# Patient Record
Sex: Male | Born: 1965 | Race: White | Hispanic: No | Smoking: Never smoker
Health system: Southern US, Community
[De-identification: ages and names within clinical notes are randomized; demographics above are authoritative.]

## PROBLEM LIST (undated history)

## (undated) DIAGNOSIS — I10 Essential (primary) hypertension: Secondary | ICD-10-CM

## (undated) DIAGNOSIS — M109 Gout, unspecified: Secondary | ICD-10-CM

## (undated) DIAGNOSIS — F419 Anxiety disorder, unspecified: Secondary | ICD-10-CM

## (undated) HISTORY — PX: ESOPHAGEAL DILATION: SHX303

---

## 2007-12-08 HISTORY — PX: OTHER SURGICAL HISTORY: SHX169

## 2014-11-20 DIAGNOSIS — K645 Perianal venous thrombosis: Secondary | ICD-10-CM | POA: Insufficient documentation

## 2015-07-22 LAB — CBC AND DIFFERENTIAL
HCT: 44 % (ref 41–53)
Hemoglobin: 15.2 g/dL (ref 13.5–17.5)
Platelets: 161 10*3/uL (ref 150–399)
WBC: 5.4 10^3/mL

## 2015-07-22 LAB — BASIC METABOLIC PANEL
BUN: 7 mg/dL (ref 4–21)
Creatinine: 0.9 mg/dL (ref 0.6–1.3)
Glucose: 92 mg/dL
Potassium: 4.5 mmol/L (ref 3.4–5.3)
Sodium: 144 mmol/L (ref 137–147)

## 2015-07-22 LAB — LIPID PANEL
Cholesterol: 133 mg/dL (ref 0–200)
HDL: 31 mg/dL — AB (ref 35–70)
LDL Cholesterol: 102 mg/dL
Triglycerides: 265 mg/dL — AB (ref 40–160)

## 2016-01-13 ENCOUNTER — Emergency Department (INDEPENDENT_AMBULATORY_CARE_PROVIDER_SITE_OTHER)
Admission: EM | Admit: 2016-01-13 | Discharge: 2016-01-13 | Disposition: A | Discharge status: Home / Self Care | Payer: Managed Care, Other (non HMO) | Source: Home / Self Care | Attending: Emergency Medicine | Admitting: Emergency Medicine

## 2016-01-13 ENCOUNTER — Encounter: Payer: Self-pay | Admitting: Emergency Medicine

## 2016-01-13 DIAGNOSIS — L03032 Cellulitis of left toe: Secondary | ICD-10-CM

## 2016-01-13 HISTORY — DX: Gout, unspecified: M10.9

## 2016-01-13 MED ORDER — DOXYCYCLINE HYCLATE 100 MG PO CAPS: 100.0000 mg | ORAL_CAPSULE | Freq: Two times a day (BID) | ORAL | Status: DC

## 2016-01-13 NOTE — ED Notes (Signed)
Left great toe, swollen and red. Looks like a paronychia, however he has hx of gout.

## 2016-01-13 NOTE — ED Provider Notes (Signed)
CSN: [id number]     Arrival date & time 01/13/16  1029 History   First MD Initiated Contact with Patient 01/13/16 1032     Chief Complaint  Patient presents with  . Toe Pain   (Consider location/radiation/quality/duration/timing/severity/associated sxs/prior Treatment) HPI 2 days of mild to moderate swelling and dull pain dorsum left great toe. Not affecting joint. Recalls no injury. No radiation, numbness, or weakness. Has not tried any particular treatment. Denies fever or chills or nausea or vomiting or chest pain or shortness of breath. He states he had a diagnosis of possible gout right ankle a few years ago, which resolved and has not recurred now that he's eating healthier. He denies history of crystal documented gout. Denies itch Past Medical History  Diagnosis Date  . Gout    History reviewed. No pertinent past surgical history. Family History  Problem Relation Age of Onset  . Hypertension Mother   . ALS Father    Social History  Substance Use Topics  . Smoking status: Never Smoker   . Smokeless tobacco: None  . Alcohol Use: Yes    Review of Systems  All other systems reviewed and are negative.   Allergies  Morphine and related  Home Medications   Prior to Admission medications   Medication Sig Start Date End Date Taking? Authorizing Provider  esomeprazole (NEXIUM) 40 MG capsule Take 40 mg by mouth daily at 12 noon.   Yes Historical Provider, MD  fluticasone (FLONASE) 50 MCG/ACT nasal spray Place into both nostrils daily.   Yes Historical Provider, MD  testosterone cypionate (DEPOTESTOTERONE CYPIONATE) 100 MG/ML injection Inject into the muscle every 14 (fourteen) days. For IM use only   Yes Historical Provider, MD  doxycycline (VIBRAMYCIN) 100 MG capsule Take 1 capsule (100 mg total) by mouth 2 (two) times daily. For 10 days 01/13/16   Lajean Manes, MD   Meds Ordered and Administered this Visit  Medications - No data to display  BP 147/94 mmHg  Pulse 86   Temp(Src) 98 F (36.7 C) (Oral)  Ht 6' (1.829 m)  Wt 215 lb (97.523 kg)  BMI 29.15 kg/m2  SpO2 99% No data found.   Physical Exam  Constitutional: He is oriented to person, place, and time. He appears well-developed and well-nourished. No distress.  HENT:  Head: Normocephalic and atraumatic.  Eyes: Conjunctivae and EOM are normal. Pupils are equal, round, and reactive to light. No scleral icterus.  Neck: Normal range of motion.  Cardiovascular: Normal rate.   Pulmonary/Chest: Effort normal.  Abdominal: He exhibits no distension.  Musculoskeletal: Normal range of motion.       Left ankle: Normal. No tenderness. Achilles tendon normal.       Left foot: There is normal range of motion, no bony tenderness and normal capillary refill.       Feet:  Left foot:  As depicted, area of redness, warmth, induration, mild tenderness just proximal to the cuticle of the left great toe. No fluctuance or drainage or bleeding or any open wound. No red streaks. No tenderness or heat or swelling over first MTPJ. Tendons, range of motion, neurovascular intact. There is no sign of ingrown toenail or abnormality of the toenail. Neurovascular distally intact  Neurological: He is alert and oriented to person, place, and time.  Skin: Skin is warm.  Psychiatric: He has a normal mood and affect.  Nursing note and vitals reviewed.   ED Course  Procedures (including critical care time)  Labs Review Labs Reviewed -  No data to display  Imaging Review No results found.    MDM   1. Cellulitis of great toe of left foot    no evidence of fluctuance or red streaks or any bone involvement. Clinically, not consistent with gout as no bone or joint involvement. Afebrile, no evidence of toxicity. This is likely a mild paronychia left great toe.  Treatment options discussed, as well as risks, benefits, alternatives. Patient voiced understanding and agreement with the following plans: New Prescriptions    DOXYCYCLINE (VIBRAMYCIN) 100 MG CAPSULE    Take 1 capsule (100 mg total) by mouth 2 (two) times daily. For 10 days   Warm soaks and other symptomatic care discussed. Follow-up with your primary care doctor in 5-7 days if not improving, or sooner if symptoms become worse. If worsening or becomes fluctuant, return here to urgent care to consider I&D, but that is not indicated at this time. Precautions discussed. Red flags discussed. Questions invited and answered. Patient voiced understanding and agreement.    Lajean Manes, MD 01/13/16 [phone]

## 2016-03-30 ENCOUNTER — Ambulatory Visit: Payer: Managed Care, Other (non HMO) | Admitting: Physician Assistant

## 2016-04-06 ENCOUNTER — Encounter: Payer: Self-pay | Admitting: Physician Assistant

## 2016-04-06 ENCOUNTER — Ambulatory Visit (INDEPENDENT_AMBULATORY_CARE_PROVIDER_SITE_OTHER): Payer: Managed Care, Other (non HMO) | Admitting: Physician Assistant

## 2016-04-06 VITALS — BP 162/93 | HR 78 | Ht 72.0 in | Wt 220.0 lb

## 2016-04-06 DIAGNOSIS — K2100 Gastro-esophageal reflux disease with esophagitis, without bleeding: Secondary | ICD-10-CM

## 2016-04-06 DIAGNOSIS — K222 Esophageal obstruction: Secondary | ICD-10-CM

## 2016-04-06 DIAGNOSIS — G4700 Insomnia, unspecified: Secondary | ICD-10-CM

## 2016-04-06 DIAGNOSIS — F4323 Adjustment disorder with mixed anxiety and depressed mood: Secondary | ICD-10-CM | POA: Insufficient documentation

## 2016-04-06 DIAGNOSIS — F419 Anxiety disorder, unspecified: Secondary | ICD-10-CM | POA: Insufficient documentation

## 2016-04-06 DIAGNOSIS — R079 Chest pain, unspecified: Secondary | ICD-10-CM | POA: Insufficient documentation

## 2016-04-06 DIAGNOSIS — I10 Essential (primary) hypertension: Secondary | ICD-10-CM | POA: Diagnosis not present

## 2016-04-06 DIAGNOSIS — K219 Gastro-esophageal reflux disease without esophagitis: Secondary | ICD-10-CM | POA: Insufficient documentation

## 2016-04-06 MED ORDER — LORAZEPAM 1 MG PO TABS: 1.0000 mg | ORAL_TABLET | Freq: Two times a day (BID) | ORAL | Status: DC

## 2016-04-06 MED ORDER — FLUTICASONE PROPIONATE 50 MCG/ACT NA SUSP: 2.0000 | Freq: Every day | NASAL | Status: DC

## 2016-04-06 MED ORDER — ZOLPIDEM TARTRATE 10 MG PO TABS: 10.0000 mg | ORAL_TABLET | Freq: Every evening | ORAL | Status: DC | PRN

## 2016-04-06 MED ORDER — FLUOXETINE HCL 20 MG PO TABS: 20.0000 mg | ORAL_TABLET | Freq: Every day | ORAL | Status: DC

## 2016-04-06 MED ORDER — METOPROLOL SUCCINATE ER 25 MG PO TB24: 25.0000 mg | ORAL_TABLET | Freq: Every day | ORAL | Status: DC

## 2016-04-06 NOTE — Progress Notes (Signed)
Subjective:    Patient ID: Mason Rangel, male    DOB: 06/28/1966, 50 y.o.   MRN: [id number]  HPI  Pt is a 50 yo male who presents to the clinic to establish care.   .. Active Ambulatory Problems    Diagnosis Date Noted  . Left sided chest pain 04/06/2016  . Anxiety 04/06/2016  . Insomnia 04/06/2016  . Adjustment disorder with mixed anxiety and depressed mood 04/06/2016  . Essential hypertension, benign 04/06/2016  . GERD (gastroesophageal reflux disease) 04/06/2016  . Esophageal stenosis 04/06/2016   Resolved Ambulatory Problems    Diagnosis Date Noted  . No Resolved Ambulatory Problems   Past Medical History  Diagnosis Date  . Gout    .[name] Kitchen Family History  Problem Relation Age of Onset  . Hypertension Mother   . ALS Father   . Stroke Maternal Grandfather   . Heart attack Paternal Grandmother   . Heart attack Paternal Grandfather    .[name] Kitchen Social History   Social History  . Marital Status: Divorced    Spouse Name: N/A  . Number of Children: N/A  . Years of Education: N/A   Occupational History  . Not on file.   Social History Main Topics  . Smoking status: Never Smoker   . Smokeless tobacco: Not on file  . Alcohol Use: Yes  . Drug Use: No  . Sexual Activity: Not Currently   Other Topics Concern  . Not on file   Social History Narrative   On 4/28 he went to ER with left sided CP. Full cardiac work up was done and negative for any cardiac pathology. They thought is was likely due to stress. Pt can confirm that. He has had some recent stressors. A relationship recently broke up and he was paired with a partner that has a reputation of being hard to work with. He has had elevated BP and has a family hx of MI. He is having problems sleeping and concentrating. He works hard but feels "life has caught up with him". He admited that night in hotel before he went to ER he thought might be better if it was all over. He now feels differently. He knows he is loved.      Review of Systems  All other systems reviewed and are negative.      Objective:   Physical Exam  Constitutional: He is oriented to person, place, and time. He appears well-developed and well-nourished.  HENT:  Head: Normocephalic and atraumatic.  Cardiovascular: Normal rate, regular rhythm and normal heart sounds.   Pulmonary/Chest: Effort normal and breath sounds normal. He has no wheezes.  Abdominal: Soft. Bowel sounds are normal.  Neurological: He is alert and oriented to person, place, and time.  Skin: Skin is dry.  Psychiatric: He has a normal mood and affect. His behavior is normal.          Assessment & Plan:  Left sided chest pain- has been seen by ER and ruled out cardiac causes. After today pt is under a lot of anxiety and stress that I think is contributing to chest pain.   Insomnia- refilled ambien for 6 months.   HTN- remains elevated. Hx of heart palpitations. Will start metoprolol daily.   Anxiety/adjustment disorder- started prozac daily. RF and side effects discussed. I do think patient would benefit from counseling. Will make referral to counseling. Written out of work for 2weeks. Can return without any limitations on may 15th 2017. Ativan given as needed.  Discussed abuse potential. Suggested 1/2 tablet bid or as needed.

## 2016-04-08 ENCOUNTER — Ambulatory Visit: Payer: Managed Care, Other (non HMO) | Admitting: Physician Assistant

## 2016-04-10 ENCOUNTER — Telehealth: Payer: Self-pay | Admitting: *Deleted

## 2016-04-10 NOTE — Telephone Encounter (Signed)
Pt left vm wanting you to know that he's stopping the Ativan due to it giving him "severe deathly dreams".  He also stated that he has an appt with a counselor at [facility] on the 29th.

## 2016-04-13 ENCOUNTER — Encounter: Payer: Self-pay | Admitting: Physician Assistant

## 2016-04-15 ENCOUNTER — Telehealth: Payer: Self-pay | Admitting: *Deleted

## 2016-04-15 NOTE — Telephone Encounter (Signed)
Spoke with pt today and he wanted to give you some updates.  He is currently adjusting his ativan & ambien to find the right combination.  Currently, he's doing 1 tab of ambien & half tab of ativan but this is making his sleep too much, so he's is going to try half a tab of ambien with the half of ativan. Also, he hasn't started the prozac yet.  He has an appt on the 26th with the counselor.  His short term disability is approved until Sunday May 28, making the actual return day the day after [facility].  He has a form for you to fill out for his release to work and will drop that off for you sometime next week.

## 2016-04-16 NOTE — Telephone Encounter (Signed)
Please take ativan ok allergy list. Encourage him to start prozac this can also help his sleep.

## 2016-04-17 NOTE — Telephone Encounter (Signed)
Done

## 2016-05-18 ENCOUNTER — Ambulatory Visit: Payer: Managed Care, Other (non HMO) | Admitting: Physician Assistant

## 2016-05-22 ENCOUNTER — Ambulatory Visit: Payer: Managed Care, Other (non HMO) | Admitting: Physician Assistant

## 2016-05-25 ENCOUNTER — Ambulatory Visit (INDEPENDENT_AMBULATORY_CARE_PROVIDER_SITE_OTHER): Payer: Managed Care, Other (non HMO) | Admitting: Physician Assistant

## 2016-05-25 ENCOUNTER — Encounter: Payer: Self-pay | Admitting: Physician Assistant

## 2016-05-25 VITALS — BP 135/96 | HR 85 | Ht 72.0 in | Wt 224.0 lb

## 2016-05-25 DIAGNOSIS — I10 Essential (primary) hypertension: Secondary | ICD-10-CM | POA: Diagnosis not present

## 2016-05-25 DIAGNOSIS — F4323 Adjustment disorder with mixed anxiety and depressed mood: Secondary | ICD-10-CM | POA: Diagnosis not present

## 2016-05-25 DIAGNOSIS — F419 Anxiety disorder, unspecified: Secondary | ICD-10-CM

## 2016-05-25 MED ORDER — BUPROPION HCL ER (XL) 150 MG PO TB24: 150.0000 mg | ORAL_TABLET | Freq: Every day | ORAL | Status: DC

## 2016-05-25 MED ORDER — METOPROLOL SUCCINATE ER 25 MG PO TB24: 25.0000 mg | ORAL_TABLET | Freq: Every day | ORAL | Status: DC

## 2016-05-25 NOTE — Progress Notes (Signed)
   Subjective:    Patient ID: Mason Rangel, male    DOB: 07/07/1966, 50 y.o.   MRN: [id number]  HPI Patient is a 50 year old male who presents to the clinic for follow-up on anxiety/depression/insomnia. He states he is doing much better today. He is working with the therapist on his mood. He did start Prozac but he did not like the way it made him feel. He went ahead and stopped it. He is talked with other providers and would like to try Wellbutrin. He denies any suicidal or homicidal thoughts. He is doing well at work. He has really lost to the therapist that he really needs to move out of his parents house. He thinks if he had some independency he might be able to have more of a social life. His sleep has improved. He is not taking Ativan. He is only taking Ambien. He denies any shortness of breath, palpitations, chest pains, he takes metoprolol daily. He's been checking his blood pressures at home and they've been ranging in the 120s to 130s over 80s to 90s.   Review of Systems  All other systems reviewed and are negative.      Objective:   Physical Exam  Constitutional: He is oriented to person, place, and time. He appears well-developed and well-nourished.  HENT:  Head: Normocephalic and atraumatic.  Cardiovascular: Normal rate, regular rhythm and normal heart sounds.   Pulmonary/Chest: Effort normal and breath sounds normal.  Neurological: He is alert and oriented to person, place, and time.  Psychiatric: He has a normal mood and affect. His behavior is normal.          Assessment & Plan:  Adjustment disorder mixed anxiety and depression- PHQ-9 was 9 and GAD-7 was 5. Will start wellbutrin at patients request. Already stopped prozac. Discussed SE's of wellbutrin. Follow up in 2 months.   Insomnia- continue ambien. Continue to work on meditation and deep breathing techniques.   HTN- much improved on metoprolol. At home ranging 120-130/80-90. Refilled for next 6 months.

## 2016-06-02 ENCOUNTER — Other Ambulatory Visit: Payer: Self-pay | Admitting: *Deleted

## 2016-06-02 MED ORDER — FLUTICASONE PROPIONATE 50 MCG/ACT NA SUSP
2.0000 | Freq: Every day | NASAL | Status: DC
Start: 2016-06-02 — End: 2018-03-30

## 2016-06-05 ENCOUNTER — Other Ambulatory Visit: Payer: Self-pay | Admitting: *Deleted

## 2016-06-05 ENCOUNTER — Telehealth: Payer: Self-pay | Admitting: *Deleted

## 2016-06-05 MED ORDER — TRAZODONE HCL 50 MG PO TABS: 50.0000 mg | ORAL_TABLET | Freq: Every evening | ORAL | Status: DC | PRN

## 2016-06-05 NOTE — Telephone Encounter (Signed)
Pt notified of rx. 

## 2016-06-05 NOTE — Telephone Encounter (Signed)
Pt left vm stating that his therapist suggested Traodone for him instead of Ambien.

## 2016-06-05 NOTE — Telephone Encounter (Signed)
Ok to send trazadone 50mg  and may take up to 2 tablets one hour before bed #60 2 refills.

## 2016-07-10 ENCOUNTER — Ambulatory Visit (INDEPENDENT_AMBULATORY_CARE_PROVIDER_SITE_OTHER): Payer: Managed Care, Other (non HMO) | Admitting: Physician Assistant

## 2016-07-10 ENCOUNTER — Ambulatory Visit: Payer: Managed Care, Other (non HMO)

## 2016-07-10 ENCOUNTER — Encounter: Payer: Self-pay | Admitting: Physician Assistant

## 2016-07-10 ENCOUNTER — Ambulatory Visit (INDEPENDENT_AMBULATORY_CARE_PROVIDER_SITE_OTHER): Payer: Managed Care, Other (non HMO)

## 2016-07-10 VITALS — BP 164/100 | HR 79 | Ht 72.0 in | Wt 229.0 lb

## 2016-07-10 DIAGNOSIS — M25476 Effusion, unspecified foot: Secondary | ICD-10-CM | POA: Insufficient documentation

## 2016-07-10 DIAGNOSIS — M79671 Pain in right foot: Secondary | ICD-10-CM | POA: Insufficient documentation

## 2016-07-10 DIAGNOSIS — I10 Essential (primary) hypertension: Secondary | ICD-10-CM | POA: Diagnosis not present

## 2016-07-10 DIAGNOSIS — M7731 Calcaneal spur, right foot: Secondary | ICD-10-CM

## 2016-07-10 DIAGNOSIS — K2100 Gastro-esophageal reflux disease with esophagitis, without bleeding: Secondary | ICD-10-CM

## 2016-07-10 DIAGNOSIS — M25474 Effusion, right foot: Secondary | ICD-10-CM

## 2016-07-10 DIAGNOSIS — K222 Esophageal obstruction: Secondary | ICD-10-CM

## 2016-07-10 MED ORDER — MELOXICAM 15 MG PO TABS: 15.0000 mg | ORAL_TABLET | Freq: Every day | ORAL | 1 refills | Status: DC

## 2016-07-10 MED ORDER — DEXLANSOPRAZOLE 60 MG PO CPDR: 60.0000 mg | DELAYED_RELEASE_CAPSULE | Freq: Every day | ORAL | 5 refills | Status: DC

## 2016-07-10 NOTE — Progress Notes (Signed)
   Subjective:    Patient ID: Mason Rangel, male    DOB: 1966/03/04, 50 y.o.   MRN: [id number]  HPI Patient is a 50 year old male who presents to the clinic with swelling around his right ankle and foot accompanied by pain. This has been going on for approximately 20 days. He does not remember any acute injury or trauma. He does remember the morning 20 days ago that he woke up and when he stepped out of bed there was some pain. It has continued to swell off and on and be painful when he is walking and inverting his ankle. He has not tried anything to make better. He has been walking differently to avoid putting any pressure on the heel of his foot.  He was recently in the emergency room after having a piece of chicken wash in his esophagus. His blood pressure has been high since. He denies any chest pains, palpitations, headaches or dizziness. He is on Nexium daily. He was told after the EGD that there was lots of inflammation in his esophagus. He has not been told to change medications.   Review of Systems See HPI>     Objective:   Physical Exam  Constitutional: He is oriented to person, place, and time. He appears well-developed and well-nourished.  HENT:  Head: Normocephalic and atraumatic.  Cardiovascular: Normal rate, regular rhythm and normal heart sounds.   Pulmonary/Chest: Effort normal and breath sounds normal.  Musculoskeletal:  ROM of right foot limited due to swelling and pain.  No pain with ROM except resisted inversion.  Tenderness to palpation just infront of lateral malleolus at the suspect cubiod bone.  Non-pitting swelling around lateral malleolous into right forefoot.   Neurological: He is alert and oriented to person, place, and time.  Skin: Skin is dry.  Psychiatric: He has a normal mood and affect. His behavior is normal.          Assessment & Plan:  Essential hypertension-blood pressure uncontrolled today. Increase metoprolol to 50 mg for the next 2-3  days. If blood pressure not under 140/90 increased to 75 mg and if not to goal increased to 100 mg. Follow-up in 2 weeks to recheck blood pressure.  Right ankle and foot swelling and pain- xray normal other than calcaneal spur. Venous U/s negative for DVT. Unclear what is exactly causing this pain however I think it is musculoskeletal. There could be some tendinitis or even a metatarsal stress fracture. I went ahead and placed him in a postop shoe today to take some of the pressure off his foot. Discussed icing and elevation. I would like for him to stay off his feet mostly this weekend. He was given meloxicam to take once daily for inflammation and pain. I would like for him to stay in the boot for at least 2 weeks. Follow-up in 2 weeks.  GERD/esophageal stenosis-patient had episode where he had to go to the emergency room and have a piece of chicken surgically removed due to obstruction. They told him he had a lot of irritation and polyps in his esophagus. He is on Nexium but they have not changed any of his medications. He has not heard from his GI doctor. I will go ahead and switch him from Nexium to Dexilant daily.

## 2016-07-10 NOTE — Patient Instructions (Addendum)
Wear post op shoe. Ice twice a day for 15 minutes. mobic daily for next 2 weeks.  Get ultrasound to rule out bloodclot.  Increase metoprlol to  if not under 140/90 increase to .  Follow up in 2 weeks for BP check .    Metatarsal Fracture With Rehab A metatarsal fracture is a broken bone in one of the five bones that connect your toes to the rest of your foot (forefoot fracture). Metatarsals are long bones that can be stressed or cracked easily. A metatarsal fracture can be:  A stress fracture. Stress fractures are cracks in the surface of the metatarsal bone. Athletes often get stress fractures.  A complete fracture. A complete fracture goes all the way through the bone. The bone that connects to the pinky toe (fifth metatarsal) is the most commonly fractured metatarsal. Ballet dancers often fracture this bone. CAUSES  Stress fractures may be caused by:  Poor training technique.  Sudden increase in activity.  Changing your activity to a harder surface.  Wearing athletic shoes that do not have enough cushioning. Complete fractures are usually caused by:  Dropping a heavy object on your foot.  An injury that severely twists your foot. SIGNS AND SYMPTOMS The most common symptom of a stress fracture is foot pain that goes away with rest. The most common symptom of a complete fracture is intense pain that persists after the injury. Other symptoms of both fracture types include:  Bruising.  Swelling.  Pain with movement or putting weight on the foot.  Tenderness or pain with pressure.  Trouble walking. DIAGNOSIS  Your health care provider may suspect a metatarsal fracture based on your symptoms and medical history. Your health care provider will also do a physical exam. During the physical exam, your health care provider may try to move your foot and toes to check for pain and limited movement. Your foot will also be checked  for:  Bruising.  Tenderness.  Swelling.  Deformity. Other tests that may be done include:  X-rays. X-rays are able to show most fractures.  A bone scan. This test may be necessary to show a stress fracture. TREATMENT  Treatment for a metatarsal fracture depends on how severe the fracture was and the type of fracture. Treatment may include:  Surgery. Surgery is usually needed to repair a displaced fracture. A displaced fracture happens when pieces of the broken bone are moved out of place (displacement). After surgery, you may need to wear a short walking cast for 6 to 8 weeks.  Medicines. Medicines may be used to reduce swelling and pain.  Physical therapy. Physical therapy may last for several months.  Use of a supportive device, such as:  Elastic wrap.  Splint.  Boot.  Cast.  Crutches to support weight on your foot until the broken bone heals. You can usually treat a stress fracture or a nondisplaced fracture with:   Rest.  Ice.  Elevation.  Support. HOME CARE INSTRUCTIONS  Follow all your health care provider's instructions.  Take medicine only as directed by your health care provider.  Rest your foot until your health care provider says you can resume your usual activities.  When resting, keep your foot raised above the level of your heart (elevated).  Ice may help reduce pain and swelling.  Place ice in a plastic bag.  Place a towel between your skin and the bag.  Leave the ice on for 20 minutes, 2-3 times a day.  Wear your supportive device  as directed.  Do not get your cast or splint wet.  Keep all follow-up visits as directed by your health care provider. If your health care provider recommended physical therapy, it is very important for proper healing of your injury that you keep all visits. PREVENTION  After your fracture has healed, you can prevent another fracture by:  Starting new sports activities gradually.  Cross training to avoid  putting stress on the same part of your foot every day (such as alternate running with swimming or biking).  Eat a healthy diet that includes plenty of calcium and vitamin D.  Wear the right athletic shoes for your sport. Replace them when they wear out.  Stop your activity or training if you have pain or swelling in your foot. Rest for a few days. SEEK MEDICAL CARE IF:  You have pain that is getting worse.  You develop chills or fever.  Your foot feels numb.  Your cast or splint is damaged.  You notice swelling or redness below your cast or splint. WHAT REHABILITATION EXERCISES CAN I DO AT HOME? Ask your health care provider or physical therapist when you can start doing exercises at home. Doing these exercises 3-5 times a week can help you regain strength and flexibility. If doing any of these exercises causes pain, stop and contact your health care provider or physical therapist. Heel Cord Stretch Do 2 sets of 10 repetitions.  Stand facing a wall with your healthy foot forward and your knee slightly bent.  Place both hands on the wall for support.  Stretch your healing foot out straight behind you.  Keep both heels on the floor.  Hold the stretch for 30 seconds.  You can also do this exercise with both knees slightly bent. Repeat the same steps. Golf [employer] Do this once every day.  Sit on a chair and roll a golf ball under your healing foot for two minutes. Towel Stretch  Sit on the floor with your legs out straight.  Loop a towel around the front of your healing foot.  Use both hands to pull the ends of the towel, stretching your foot back toward your body.  Hold the stretch for 30 seconds and repeat 3 times. Calf Raises Do 2 sets of 10 repetitions.  Stand behind a chair and use your hands to support you.  Lift your good foot off the ground.  Rise up on the front of your healing foot as high as you can.  Repeat the lift 10 times. Marble Pickup Do this  once a day.  Sit in a chair and put 20 marbles on the floor in front of you.  Use the toes of your healing foot to pick up each marble. Towel Curls Repeat this exercise 5 times.  Sit in a chair and place a small towel on the floor in front of you.  Use the toes of your healing foot to grab the towel and pull it toward you.   This information is not intended to replace advice given to you by your health care provider. Make sure you discuss any questions you have with your health care provider.   Document Released: 11/23/2005 Document Revised: 04/09/2015 Document Reviewed: 03/08/2014 Elsevier Interactive Patient Education [employer]

## 2016-08-04 ENCOUNTER — Other Ambulatory Visit: Payer: Self-pay | Admitting: *Deleted

## 2016-08-04 MED ORDER — METOPROLOL SUCCINATE ER 25 MG PO TB24: 25.0000 mg | ORAL_TABLET | Freq: Every day | ORAL | 1 refills | Status: DC

## 2016-08-04 MED ORDER — DEXLANSOPRAZOLE 60 MG PO CPDR: 60.0000 mg | DELAYED_RELEASE_CAPSULE | Freq: Every day | ORAL | 1 refills | Status: DC

## 2016-09-08 ENCOUNTER — Other Ambulatory Visit: Payer: Self-pay | Admitting: *Deleted

## 2016-09-08 MED ORDER — TRAZODONE HCL 50 MG PO TABS: 50.0000 mg | ORAL_TABLET | Freq: Every evening | ORAL | 1 refills | Status: DC | PRN

## 2016-09-18 ENCOUNTER — Other Ambulatory Visit: Payer: Self-pay | Admitting: Sports Medicine

## 2016-09-18 MED ORDER — VALACYCLOVIR HCL 1 G PO TABS: 1000.0000 mg | ORAL_TABLET | Freq: Every day | ORAL | 0 refills | Status: DC

## 2016-10-03 IMAGING — US US EXTREM LOW VENOUS*R*
1 series · 13 of 24 positions shown · non-contrast
Comparison: None.

CLINICAL DATA: 50-year-old presenting with an approximate 2-3 week
history of right foot pain and swelling. No known injuries.



[Series 1: us extrem low venous*right* · 0.07mm/px · 13 of 28 slices shown]
[im 1/28]
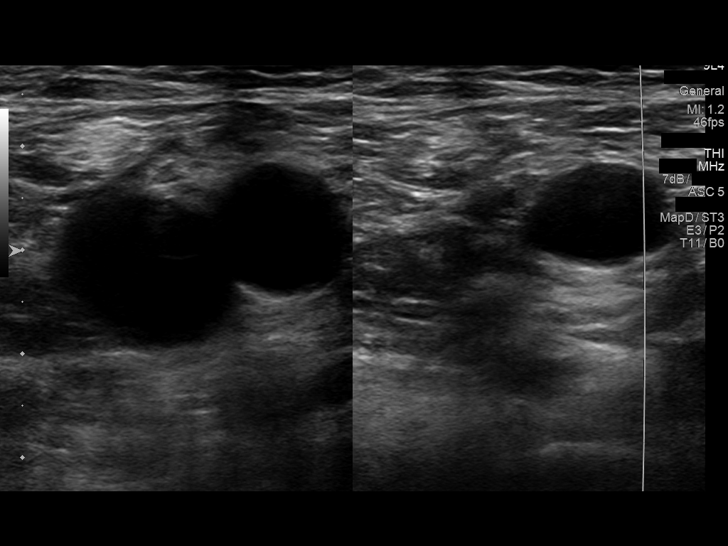
[im 3/28]
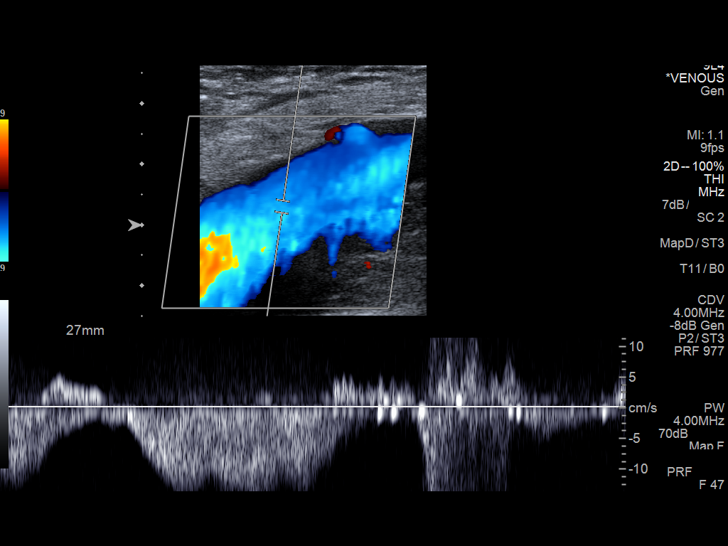
[im 5/28]
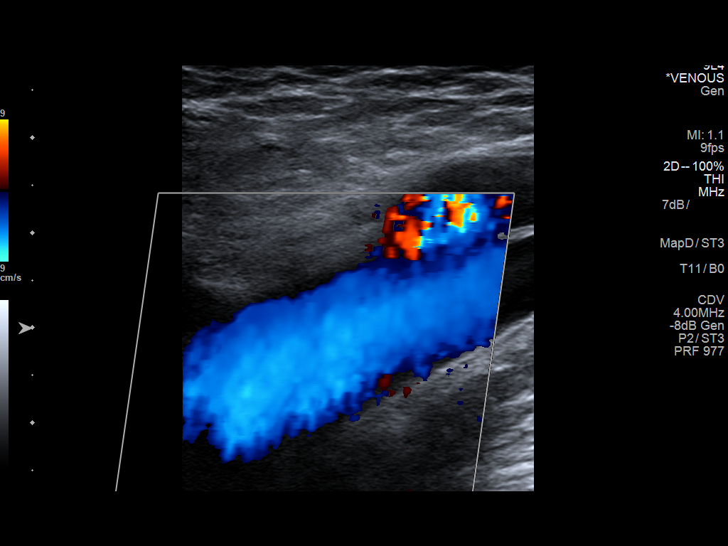
[im 8/28]
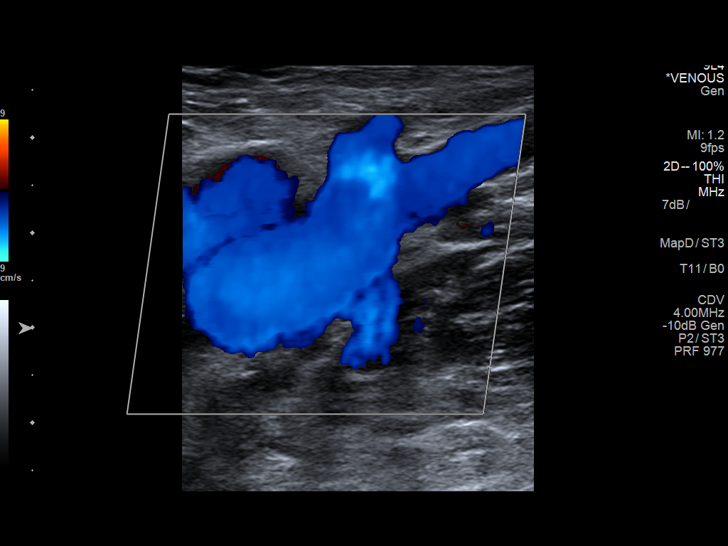
[im 10/28]
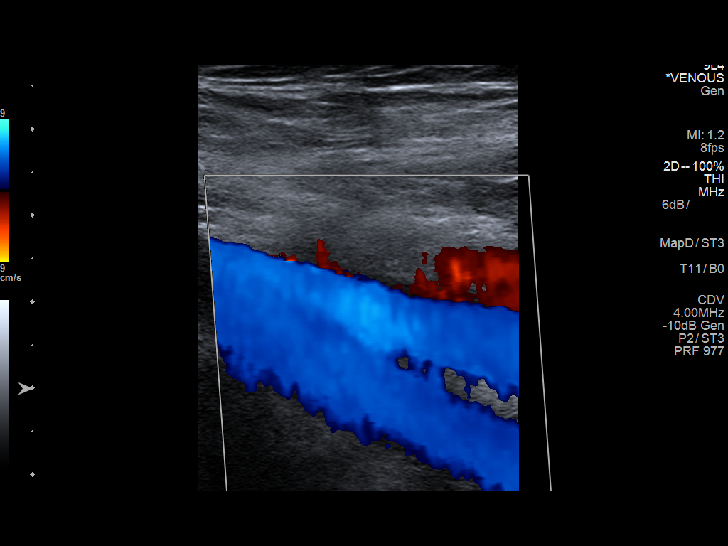
[im 12/28]
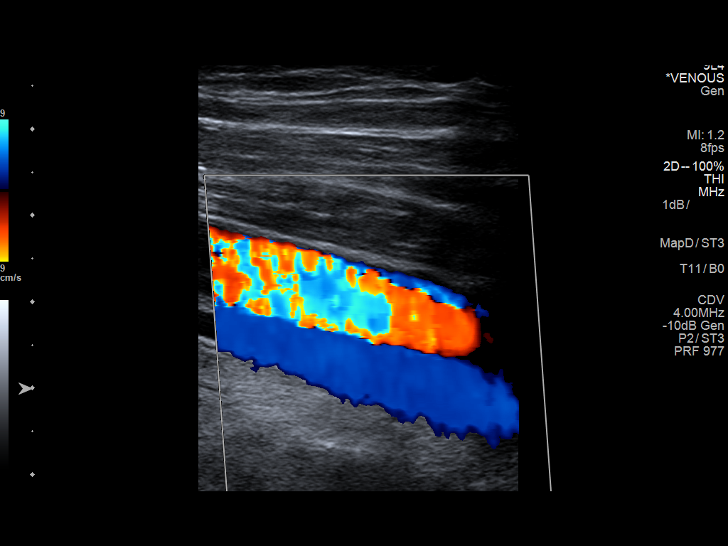
[im 15/28]
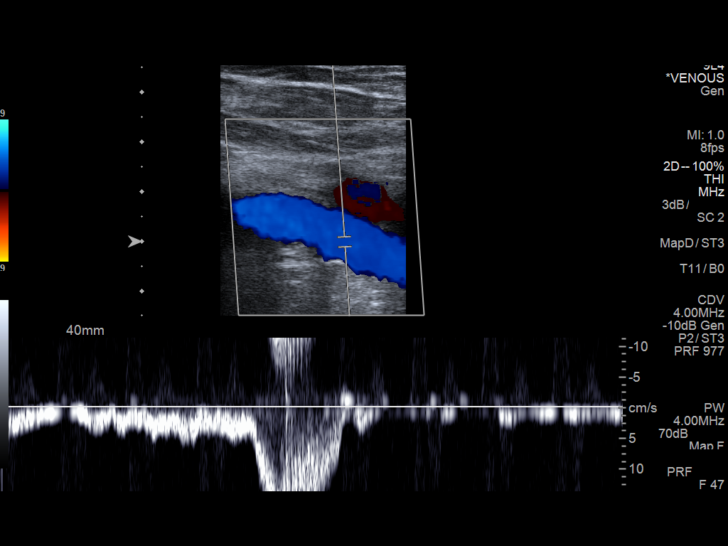
[im 16/28]
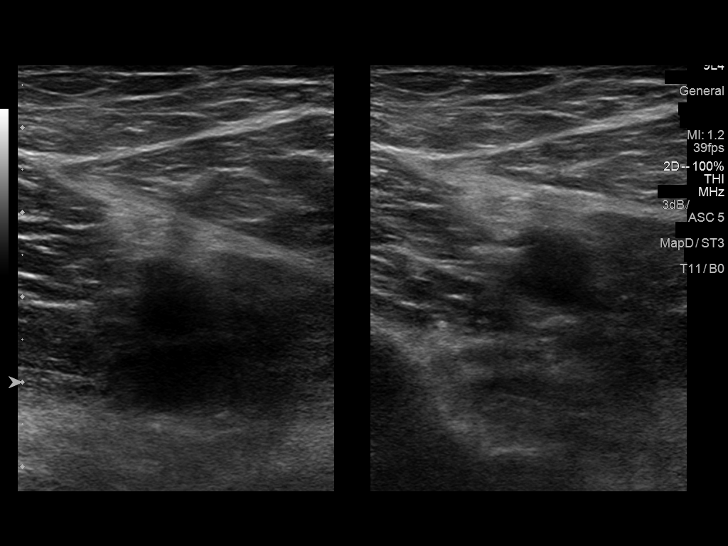
[im 18/28]
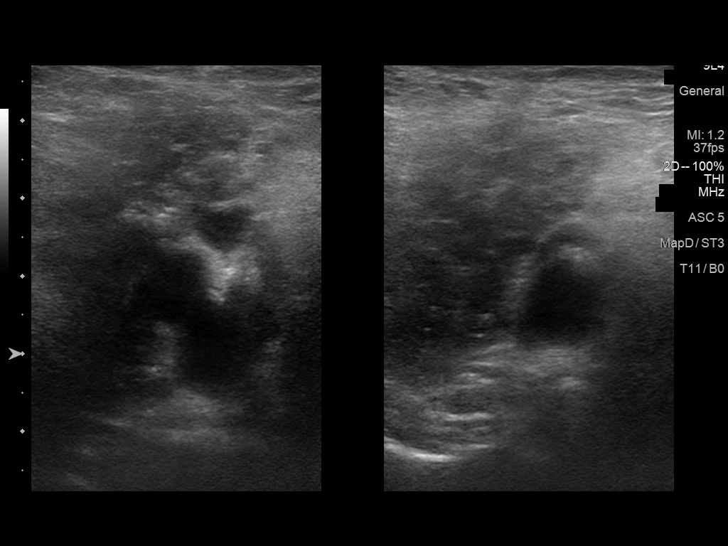
[im 20/28]
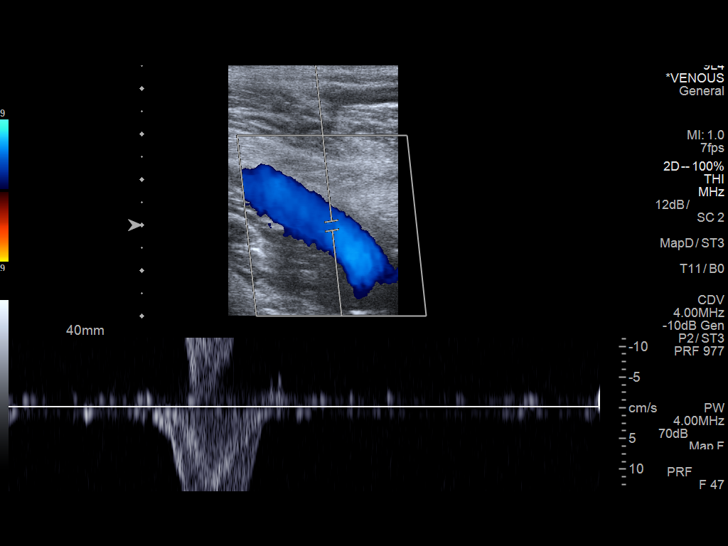
[im 23/28]
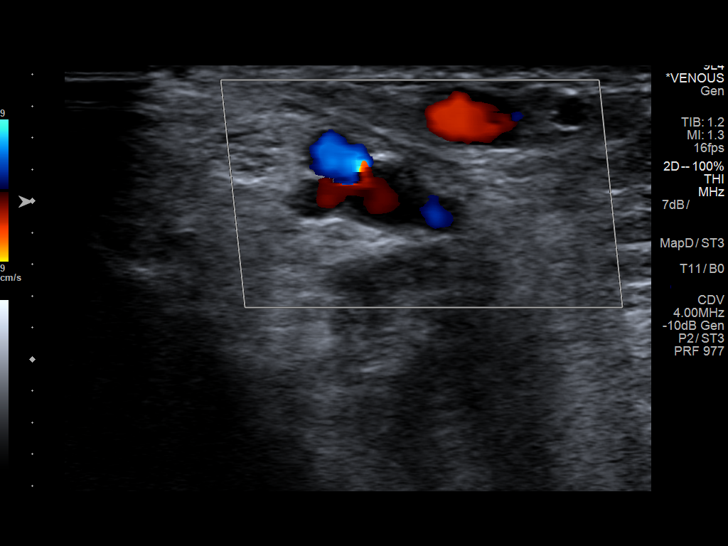
[im 25/28]
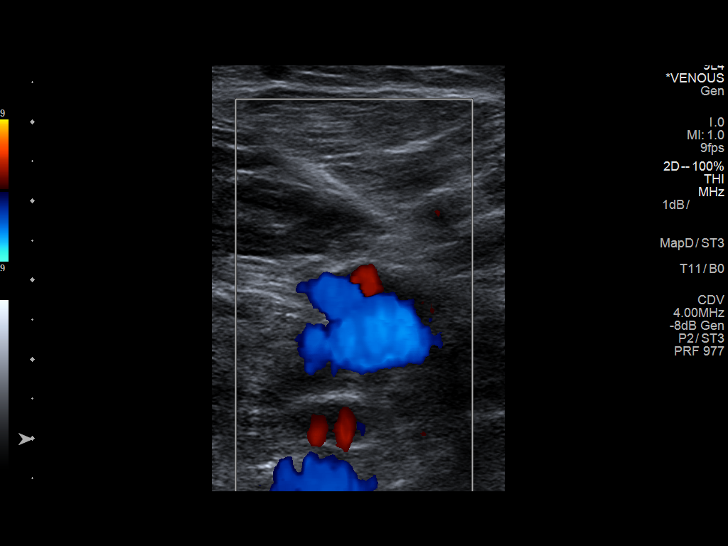
[im 28/28]
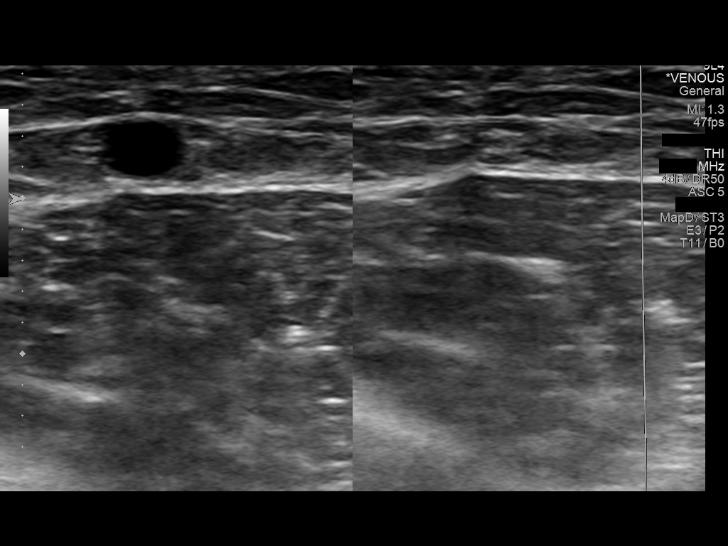

[13 of 24 positions shown; findings below may reference images not displayed]

FINDINGS: Contralateral Left Common Femoral Vein: Respiratory phasicity is
normal and symmetric with the symptomatic side. No evidence of
thrombus. Normal compressibility.

Common Femoral Vein: No evidence of thrombus. Normal
compressibility, respiratory phasicity and response to augmentation.

Saphenofemoral Junction: No evidence of thrombus. Normal
compressibility and flow on color Doppler imaging.

Profunda Femoral Vein: No evidence of thrombus. Normal
compressibility and flow on color Doppler imaging.

Femoral Vein: No evidence of thrombus. Normal compressibility,
respiratory phasicity and response to augmentation.

Popliteal Vein: No evidence of thrombus. Normal compressibility,
respiratory phasicity and response to augmentation.

Calf Veins: No evidence of thrombus. Normal compressibility and flow
on color Doppler imaging.

Superficial Great Saphenous Vein: No evidence of thrombus. Normal
compressibility and flow on color Doppler imaging.

Venous Reflux:  Not evaluated.

Other Findings:  None.
IMPRESSION: No evidence of right lower extremity DVT.

## 2016-10-05 ENCOUNTER — Encounter: Payer: Managed Care, Other (non HMO) | Admitting: Physician Assistant

## 2016-10-15 ENCOUNTER — Other Ambulatory Visit: Payer: Self-pay | Admitting: *Deleted

## 2016-10-15 MED ORDER — METOPROLOL SUCCINATE ER 50 MG PO TB24: 50.0000 mg | ORAL_TABLET | Freq: Every day | ORAL | 0 refills | Status: DC

## 2016-10-19 ENCOUNTER — Other Ambulatory Visit: Payer: Self-pay

## 2016-10-19 ENCOUNTER — Ambulatory Visit (INDEPENDENT_AMBULATORY_CARE_PROVIDER_SITE_OTHER): Payer: Managed Care, Other (non HMO) | Admitting: Physician Assistant

## 2016-10-19 ENCOUNTER — Encounter: Payer: Self-pay | Admitting: Physician Assistant

## 2016-10-19 VITALS — BP 112/65 | HR 76 | Ht 72.0 in | Wt 217.0 lb

## 2016-10-19 DIAGNOSIS — E782 Mixed hyperlipidemia: Secondary | ICD-10-CM

## 2016-10-19 DIAGNOSIS — Z131 Encounter for screening for diabetes mellitus: Secondary | ICD-10-CM

## 2016-10-19 DIAGNOSIS — I10 Essential (primary) hypertension: Secondary | ICD-10-CM | POA: Diagnosis not present

## 2016-10-19 DIAGNOSIS — K2100 Gastro-esophageal reflux disease with esophagitis, without bleeding: Secondary | ICD-10-CM

## 2016-10-19 DIAGNOSIS — Z0000 Encounter for general adult medical examination without abnormal findings: Secondary | ICD-10-CM | POA: Diagnosis not present

## 2016-10-19 DIAGNOSIS — E291 Testicular hypofunction: Secondary | ICD-10-CM | POA: Insufficient documentation

## 2016-10-19 DIAGNOSIS — Z130 Encounter for screening for diseases of the blood and blood-forming organs and certain disorders involving the immune mechanism: Secondary | ICD-10-CM

## 2016-10-19 DIAGNOSIS — K222 Esophageal obstruction: Secondary | ICD-10-CM

## 2016-10-19 DIAGNOSIS — F5101 Primary insomnia: Secondary | ICD-10-CM

## 2016-10-19 DIAGNOSIS — Z23 Encounter for immunization: Secondary | ICD-10-CM

## 2016-10-19 DIAGNOSIS — Z1329 Encounter for screening for other suspected endocrine disorder: Secondary | ICD-10-CM

## 2016-10-19 DIAGNOSIS — Z13220 Encounter for screening for lipoid disorders: Secondary | ICD-10-CM

## 2016-10-19 LAB — HEMOGLOBIN A1C
Hgb A1c MFr Bld: 5.1 % (ref <5.7)
Mean Plasma Glucose: 100 mg/dL

## 2016-10-19 LAB — LIPID PANEL
Cholesterol: 136 mg/dL (ref <200)
HDL: 37 mg/dL — ABNORMAL LOW (ref >40)
LDL Cholesterol: 85 mg/dL (ref <100)
Total CHOL/HDL Ratio: 3.7 Ratio (ref <5.0)
Triglycerides: 71 mg/dL (ref <150)
VLDL: 14 mg/dL (ref <30)

## 2016-10-19 LAB — CBC WITH DIFFERENTIAL/PLATELET
Basophils Absolute: 63 cells/uL (ref 0–200)
Basophils Relative: 1 %
Eosinophils Absolute: 126 cells/uL (ref 15–500)
Eosinophils Relative: 2 %
HCT: 44 % (ref 38.5–50.0)
Hemoglobin: 14.8 g/dL (ref 13.2–17.1)
Lymphocytes Relative: 29 %
Lymphs Abs: 1827 cells/uL (ref 850–3900)
MCH: 30.6 pg (ref 27.0–33.0)
MCHC: 33.6 g/dL (ref 32.0–36.0)
MCV: 91.1 fL (ref 80.0–100.0)
MPV: 10 fL (ref 7.5–12.5)
Monocytes Absolute: 693 cells/uL (ref 200–950)
Monocytes Relative: 11 %
Neutro Abs: 3591 cells/uL (ref 1500–7800)
Neutrophils Relative %: 57 %
Platelets: 150 10*3/uL (ref 140–400)
RBC: 4.83 MIL/uL (ref 4.20–5.80)
RDW: 15.4 % — ABNORMAL HIGH (ref 11.0–15.0)
WBC: 6.3 10*3/uL (ref 3.8–10.8)

## 2016-10-19 LAB — COMPLETE METABOLIC PANEL WITH GFR
ALT: 20 U/L (ref 9–46)
AST: 22 U/L (ref 10–35)
Albumin: 3.8 g/dL (ref 3.6–5.1)
Alkaline Phosphatase: 55 U/L (ref 40–115)
BUN: 14 mg/dL (ref 7–25)
CO2: 28 mmol/L (ref 20–31)
Calcium: 9 mg/dL (ref 8.6–10.3)
Chloride: 102 mmol/L (ref 98–110)
Creat: 1.25 mg/dL (ref 0.70–1.33)
GFR, Est African American: 77 mL/min (ref >=60)
GFR, Est Non African American: 66 mL/min (ref >=60)
Glucose, Bld: 92 mg/dL (ref 65–99)
Potassium: 4.3 mmol/L (ref 3.5–5.3)
Sodium: 136 mmol/L (ref 135–146)
Total Bilirubin: 0.4 mg/dL (ref 0.2–1.2)
Total Protein: 6.5 g/dL (ref 6.1–8.1)

## 2016-10-19 LAB — TSH: TSH: 1.24 mIU/L (ref 0.40–4.50)

## 2016-10-19 MED ORDER — DEXLANSOPRAZOLE 60 MG PO CPDR: 60.0000 mg | DELAYED_RELEASE_CAPSULE | Freq: Every day | ORAL | 3 refills | Status: DC

## 2016-10-19 MED ORDER — TRAZODONE HCL 50 MG PO TABS: 50.0000 mg | ORAL_TABLET | Freq: Every evening | ORAL | 3 refills | Status: DC | PRN

## 2016-10-19 MED ORDER — METOPROLOL SUCCINATE ER 50 MG PO TB24: 50.0000 mg | ORAL_TABLET | Freq: Every day | ORAL | 1 refills | Status: DC

## 2016-10-19 MED ORDER — ZOLPIDEM TARTRATE 10 MG PO TABS: 10.0000 mg | ORAL_TABLET | Freq: Every evening | ORAL | 3 refills | Status: DC | PRN

## 2016-10-19 NOTE — Progress Notes (Addendum)
Subjective:    Patient ID: Mason Rangel, male    DOB: 1966-12-03, 50 y.o.   MRN: [id number]  HPI  Pt is a 50 yo male who presents to the clinic for CPE. No problems or concerns today.   .. Active Ambulatory Problems    Diagnosis Date Noted  . Left sided chest pain 04/06/2016  . Anxiety 04/06/2016  . Insomnia 04/06/2016  . Adjustment disorder with mixed anxiety and depressed mood 04/06/2016  . Essential hypertension, benign 04/06/2016  . GERD (gastroesophageal reflux disease) 04/06/2016  . Esophageal stenosis 04/06/2016  . Swelling of foot joint 07/10/2016  . Right foot pain 07/10/2016   Resolved Ambulatory Problems    Diagnosis Date Noted  . No Resolved Ambulatory Problems   Past Medical History:  Diagnosis Date  . Gout    .[name] Kitchen. Family History  Problem Relation Age of Onset  . Hypertension Mother   . ALS Father   . Stroke Maternal Grandfather   . Heart attack Paternal Grandmother   . Heart attack Paternal Grandfather    .[name] Kitchen. Social History   Social History  . Marital status: Divorced    Spouse name: N/A  . Number of children: N/A  . Years of education: N/A   Occupational History  . Not on file.   Social History Main Topics  . Smoking status: Never Smoker  . Smokeless tobacco: Not on file  . Alcohol use Yes  . Drug use: No  . Sexual activity: Not Currently   Other Topics Concern  . Not on file   Social History Narrative  . No narrative on file      Review of Systems  All other systems reviewed and are negative.      Objective:   Physical Exam  BP 112/65   Pulse 76   Ht 6' (1.829 m)   Wt 217 lb (98.4 kg)   BMI 29.43 kg/m   General Appearance:    Alert, cooperative, no distress, appears stated age  Head:    Normocephalic, without obvious abnormality, atraumatic  Eyes:    PERRL, conjunctiva/corneas clear, EOM's intact, fundi    benign, both eyes       Ears:    Normal TM's and external ear canals, both ears  Nose:   Nares normal,  septum midline, mucosa normal, no drainage    or sinus tenderness  Throat:   Lips, mucosa, and tongue normal; teeth and gums normal  Neck:   Supple, symmetrical, trachea midline, no adenopathy;       thyroid:  No enlargement/tenderness/nodules; no carotid   bruit or JVD  Back:     Symmetric, no curvature, ROM normal, no CVA tenderness  Lungs:     Clear to auscultation bilaterally, respirations unlabored  Chest wall:    No tenderness or deformity  Heart:    Regular rate and rhythm, S1 and S2 normal, no murmur, rub   or gallop  Abdomen:     Soft, non-tender, bowel sounds active all four quadrants,    no masses, no organomegaly     Rectal:    Normal tone, normal prostate, no masses or tenderness;   guaiac negative stool  Extremities:   Extremities normal, atraumatic, no cyanosis or edema  Pulses:   2+ and symmetric all extremities  Skin:   Skin color, texture, turgor normal, no rashes or lesions  Lymph nodes:   Cervical, supraclavicular, and axillary nodes normal  Neurologic:   CNII-XII intact. Normal strength, sensation and  reflexes      throughout        Assessment & Plan:  [name] Kitchen.[name] Kitchen.Sharma Covertorman was seen today for annual exam.  Diagnoses and all orders for this visit:  Routine physical examination  Essential hypertension, benign -     metoprolol succinate (TOPROL-XL) 50 MG 24 hr tablet; Take 1 tablet (50 mg total) by mouth daily.  Influenza vaccine needed -     Flu Vaccine QUAD 36+ mos PF IM (Fluarix & Fluzone Quad PF)  Esophageal stenosis -     dexlansoprazole (DEXILANT) 60 MG capsule; Take 1 capsule (60 mg total) by mouth daily.  Gastroesophageal reflux disease with esophagitis -     dexlansoprazole (DEXILANT) 60 MG capsule; Take 1 capsule (60 mg total) by mouth daily.  Primary insomnia -     traZODone (DESYREL) 50 MG tablet; Take 1-2 tablets (50-100 mg total) by mouth at bedtime as needed for sleep. -     zolpidem (AMBIEN) 10 MG tablet; Take 1 tablet (10 mg total) by mouth at  bedtime as needed for sleep.   Pt had labs drawn today.  PSA added since on testosterone therapy. Testosterone managed by endocrinology.  Discussed exercise 150 minutes a week.  AUA was 2. No significant problems.  Colonoscopy done 2016, normal 10 year follow up.

## 2016-10-19 NOTE — Patient Instructions (Signed)

## 2016-10-21 ENCOUNTER — Other Ambulatory Visit: Payer: Self-pay | Admitting: *Deleted

## 2016-10-21 DIAGNOSIS — K222 Esophageal obstruction: Secondary | ICD-10-CM

## 2016-10-21 DIAGNOSIS — K2100 Gastro-esophageal reflux disease with esophagitis, without bleeding: Secondary | ICD-10-CM

## 2016-10-21 MED ORDER — DEXLANSOPRAZOLE 60 MG PO CPDR: 60.0000 mg | DELAYED_RELEASE_CAPSULE | Freq: Every day | ORAL | 11 refills | Status: DC

## 2017-03-24 ENCOUNTER — Ambulatory Visit (INDEPENDENT_AMBULATORY_CARE_PROVIDER_SITE_OTHER): Payer: Managed Care, Other (non HMO) | Admitting: Family Medicine

## 2017-03-24 VITALS — BP 160/92 | HR 60 | Wt 217.0 lb

## 2017-03-24 DIAGNOSIS — I119 Hypertensive heart disease without heart failure: Secondary | ICD-10-CM | POA: Insufficient documentation

## 2017-03-24 DIAGNOSIS — F419 Anxiety disorder, unspecified: Secondary | ICD-10-CM | POA: Diagnosis not present

## 2017-03-24 DIAGNOSIS — R9431 Abnormal electrocardiogram [ECG] [EKG]: Secondary | ICD-10-CM | POA: Diagnosis not present

## 2017-03-24 DIAGNOSIS — I10 Essential (primary) hypertension: Secondary | ICD-10-CM | POA: Diagnosis not present

## 2017-03-24 DIAGNOSIS — R0789 Other chest pain: Secondary | ICD-10-CM

## 2017-03-24 MED ORDER — LISINOPRIL 10 MG PO TABS: 10.0000 mg | ORAL_TABLET | Freq: Every day | ORAL | 1 refills | Status: DC

## 2017-03-24 MED ORDER — SERTRALINE HCL 25 MG PO TABS: ORAL_TABLET | ORAL | 2 refills | Status: DC

## 2017-03-24 MED ORDER — CLONAZEPAM 0.5 MG PO TABS: 0.5000 mg | ORAL_TABLET | Freq: Two times a day (BID) | ORAL | 1 refills | Status: DC

## 2017-03-24 NOTE — Patient Instructions (Addendum)
Thank you for coming in today. Anxiety: Start zoloft  daily.  Increase to  (2 pills) daily in 1 week.  Recheck in 2 weeks.   Also use klonopin twice daily to prevent panic attacks.   Caution with both klonopin and Ambien especially with alcohol.   For heart you should hear about the heart ultrasound this week.  Let me know in a few days if you do not hear anything.   For blood pressure start lisinopril daily.  Continue metoprolol.   We will recheck blood pressure and labs in 2-4 weeks.   Lisinopril tablets What is this medicine? LISINOPRIL (lyse IN oh pril) is an ACE inhibitor. This medicine is used to treat high blood pressure and heart failure. It is also used to protect the heart immediately after a heart attack. This medicine may be used for other purposes; ask your health care provider or pharmacist if you have questions. COMMON BRAND NAME(S): Prinivil, Zestril What should I tell my health care provider before I take this medicine? They need to know if you have any of these conditions: -diabetes -heart or blood vessel disease -kidney disease -low blood pressure -previous swelling of the tongue, face, or lips with difficulty breathing, difficulty swallowing, hoarseness, or tightening of the throat -an unusual or allergic reaction to lisinopril, other ACE inhibitors, insect venom, foods, dyes, or preservatives -pregnant or trying to get pregnant -breast-feeding How should I use this medicine? Take this medicine by mouth with a glass of water. Follow the directions on your prescription label. You may take this medicine with or without food. If it upsets your stomach, take it with food. Take your medicine at regular intervals. Do not take it more often than directed. Do not stop taking except on your doctor's advice. Talk to your pediatrician regarding the use of this medicine in children. Special care may be needed. While this drug may be prescribed for children as young  as 61 years of age for selected conditions, precautions do apply. Overdosage: If you think you have taken too much of this medicine contact a poison control center or emergency room at once. NOTE: This medicine is only for you. Do not share this medicine with others. What if I miss a dose? If you miss a dose, take it as soon as you can. If it is almost time for your next dose, take only that dose. Do not take double or extra doses. What may interact with this medicine? Do not take this medicine with any of the following medications: -hymenoptera venom -sacubitril; valsartan This medicines may also interact with the following medications: -aliskiren -angiotensin receptor blockers, like losartan or valsartan -certain medicines for diabetes -diuretics -everolimus -gold compounds -lithium -NSAIDs, medicines for pain and inflammation, like ibuprofen or naproxen -potassium salts or supplements -salt substitutes -sirolimus -temsirolimus This list may not describe all possible interactions. Give your health care provider a list of all the medicines, herbs, non-prescription drugs, or dietary supplements you use. Also tell them if you smoke, drink alcohol, or use illegal drugs. Some items may interact with your medicine. What should I watch for while using this medicine? Visit your doctor or health care professional for regular check ups. Check your blood pressure as directed. Ask your doctor what your blood pressure should be, and when you should contact him or her. Do not treat yourself for coughs, colds, or pain while you are using this medicine without asking your doctor or health care professional for advice. Some ingredients may  increase your blood pressure. Women should inform their doctor if they wish to become pregnant or think they might be pregnant. There is a potential for serious side effects to an unborn child. Talk to your health care professional or pharmacist for more  information. Check with your doctor or health care professional if you get an attack of severe diarrhea, nausea and vomiting, or if you sweat a lot. The loss of too much body fluid can make it dangerous for you to take this medicine. You may get drowsy or dizzy. Do not drive, use machinery, or do anything that needs mental alertness until you know how this drug affects you. Do not stand or sit up quickly, especially if you are an older patient. This reduces the risk of dizzy or fainting spells. Alcohol can make you more drowsy and dizzy. Avoid alcoholic drinks. Avoid salt substitutes unless you are told otherwise by your doctor or health care professional. What side effects may I notice from receiving this medicine? Side effects that you should report to your doctor or health care professional as soon as possible: -allergic reactions like skin rash, itching or hives, swelling of the hands, feet, face, lips, throat, or tongue -breathing problems -signs and symptoms of kidney injury like trouble passing urine or change in the amount of urine -signs and symptoms of increased potassium like muscle weakness; chest pain; or fast, irregular heartbeat -signs and symptoms of liver injury like dark yellow or brown urine; general ill feeling or flu-like symptoms; light-colored stools; loss of appetite; nausea; right upper belly pain; unusually weak or tired; yellowing of the eyes or skin -signs and symptoms of low blood pressure like dizziness; feeling faint or lightheaded, falls; unusually weak or tired -stomach pain with or without nausea and vomiting Side effects that usually do not require medical attention (report to your doctor or health care professional if they continue or are bothersome): -changes in taste -cough -dizziness -fever -headache -sensitivity to light This list may not describe all possible side effects. Call your doctor for medical advice about side effects. You may report side effects  to FDA at 1-800-FDA-1088. Where should I keep my medicine? Keep out of the reach of children. Store at room temperature between 15 and 30 degrees C (59 and 86 degrees F). Protect from moisture. Keep container tightly closed. Throw away any unused medicine after the expiration date. NOTE: This sheet is a summary. It may not cover all possible information. If you have questions about this medicine, talk to your doctor, pharmacist, or health care provider.  2018 Elsevier/Gold Standard (2016-01-13 12:52:35)   Clonazepam tablets What is this medicine? CLONAZEPAM (kloe NA ze pam) is a benzodiazepine. It is used to treat certain types of seizures. It is also used to treat panic disorder. This medicine may be used for other purposes; ask your health care provider or pharmacist if you have questions. COMMON BRAND NAME(S): Ceberclon, Klonopin What should I tell my health care provider before I take this medicine? They need to know if you have any of these conditions: -an alcohol or drug abuse problem -bipolar disorder, depression, psychosis or other mental health condition -glaucoma -kidney or liver disease -lung or breathing disease -myasthenia gravis -Parkinson's disease -porphyria -seizures or a history of seizures -suicidal thoughts -an unusual or allergic reaction to clonazepam, other benzodiazepines, foods, dyes, or preservatives -pregnant or trying to get pregnant -breast-feeding How should I use this medicine? Take this medicine by mouth with a glass of water. Follow  the directions on the prescription label. If it upsets your stomach, take it with food or milk. Take your medicine at regular intervals. Do not take it more often than directed. Do not stop taking or change the dose except on the advice of your doctor or health care professional. A special MedGuide will be given to you by the pharmacist with each prescription and refill. Be sure to read this information carefully each  time. Talk to your pediatrician regarding the use of this medicine in children. Special care may be needed. Overdosage: If you think you have taken too much of this medicine contact a poison control center or emergency room at once. NOTE: This medicine is only for you. Do not share this medicine with others. What if I miss a dose? If you miss a dose, take it as soon as you can. If it is almost time for your next dose, take only that dose. Do not take double or extra doses. What may interact with this medicine? Do not take this medication with any of the following medicines: -narcotic medicines for cough -sodium oxybate This medicine may also interact with the following medications: -alcohol -antihistamines for allergy, cough and cold -antiviral medicines for HIV or AIDS -certain medicines for anxiety or sleep -certain medicines for depression, like amitriptyline, fluoxetine, sertraline -certain medicines for fungal infections like ketoconazole and itraconazole -certain medicines for seizures like carbamazepine, phenobarbital, phenytoin, primidone -general anesthetics like halothane, isoflurane, methoxyflurane, propofol -local anesthetics like lidocaine, pramoxine, tetracaine -medicines that relax muscles for surgery -narcotic medicines for pain -phenothiazines like chlorpromazine, mesoridazine, prochlorperazine, thioridazine This list may not describe all possible interactions. Give your health care provider a list of all the medicines, herbs, non-prescription drugs, or dietary supplements you use. Also tell them if you smoke, drink alcohol, or use illegal drugs. Some items may interact with your medicine. What should I watch for while using this medicine? Tell your doctor or health care professional if your symptoms do not start to get better or if they get worse. Do not stop taking except on your doctor's advice. You may develop a severe reaction. Your doctor will tell you how much  medicine to take. You may get drowsy or dizzy. Do not drive, use machinery, or do anything that needs mental alertness until you know how this medicine affects you. To reduce the risk of dizzy and fainting spells, do not stand or sit up quickly, especially if you are an older patient. Alcohol may increase dizziness and drowsiness. Avoid alcoholic drinks. If you are taking another medicine that also causes drowsiness, you may have more side effects. Give your health care provider a list of all medicines you use. Your doctor will tell you how much medicine to take. Do not take more medicine than directed. Call emergency for help if you have problems breathing or unusual sleepiness. The use of this medicine may increase the chance of suicidal thoughts or actions. Pay special attention to how you are responding while on this medicine. Any worsening of mood, or thoughts of suicide or dying should be reported to your health care professional right away. What side effects may I notice from receiving this medicine? Side effects that you should report to your doctor or health care professional as soon as possible: -allergic reactions like skin rash, itching or hives, swelling of the face, lips, or tongue -breathing problems -confusion -loss of balance or coordination -signs and symptoms of low blood pressure like dizziness; feeling faint or lightheaded, falls;  unusually weak or tired -suicidal thoughts or mood changes Side effects that usually do not require medical attention (report to your doctor or health care professional if they continue or are bothersome): -dizziness -headache -tiredness -upset stomach This list may not describe all possible side effects. Call your doctor for medical advice about side effects. You may report side effects to FDA at 1-800-FDA-1088. Where should I keep my medicine? Keep out of the reach of children. This medicine can be abused. Keep your medicine in a safe place to  protect it from theft. Do not share this medicine with anyone. Selling or giving away this medicine is dangerous and against the law. This medicine may cause accidental overdose and death if taken by other adults, children, or pets. Mix any unused medicine with a substance like cat litter or coffee grounds. Then throw the medicine away in a sealed container like a sealed bag or a coffee can with a lid. Do not use the medicine after the expiration date. Store at room temperature between 15 and 30 degrees C (59 and 86 degrees F). Protect from light. Keep container tightly closed. NOTE: This sheet is a summary. It may not cover all possible information. If you have questions about this medicine, talk to your doctor, pharmacist, or health care provider.  2018 Elsevier/Gold Standard (2016-05-01 18:46:32)   Sertraline tablets What is this medicine? SERTRALINE (SER tra leen) is used to treat depression. It may also be used to treat obsessive compulsive disorder, panic disorder, post-trauma stress, premenstrual dysphoric disorder (PMDD) or social anxiety. This medicine may be used for other purposes; ask your health care provider or pharmacist if you have questions. COMMON BRAND NAME(S): Zoloft What should I tell my health care provider before I take this medicine? They need to know if you have any of these conditions: -bleeding disorders -bipolar disorder or a family history of bipolar disorder -glaucoma -heart disease -high blood pressure -history of irregular heartbeat -history of low levels of calcium, magnesium, or potassium in the blood -if you often drink alcohol -liver disease -receiving electroconvulsive therapy -seizures -suicidal thoughts, plans, or attempt; a previous suicide attempt by you or a family member -take medicines that treat or prevent blood clots -thyroid disease -an unusual or allergic reaction to sertraline, other medicines, foods, dyes, or preservatives -pregnant or  trying to get pregnant -breast-feeding How should I use this medicine? Take this medicine by mouth with a glass of water. Follow the directions on the prescription label. You can take it with or without food. Take your medicine at regular intervals. Do not take your medicine more often than directed. Do not stop taking this medicine suddenly except upon the advice of your doctor. Stopping this medicine too quickly may cause serious side effects or your condition may worsen. A special MedGuide will be given to you by the pharmacist with each prescription and refill. Be sure to read this information carefully each time. Talk to your pediatrician regarding the use of this medicine in children. While this drug may be prescribed for children as young as 7 years for selected conditions, precautions do apply. Overdosage: If you think you have taken too much of this medicine contact a poison control center or emergency room at once. NOTE: This medicine is only for you. Do not share this medicine with others. What if I miss a dose? If you miss a dose, take it as soon as you can. If it is almost time for your next dose, take only  that dose. Do not take double or extra doses. What may interact with this medicine? Do not take this medicine with any of the following medications: -cisapride -dofetilide -dronedarone -linezolid -MAOIs like Carbex, Eldepryl, Marplan, Nardil, and Parnate -methylene blue (injected into a vein) -pimozide -thioridazine This medicine may also interact with the following medications: -alcohol -amphetamines -aspirin and aspirin-like medicines -certain medicines for depression, anxiety, or psychotic disturbances -certain medicines for fungal infections like ketoconazole, fluconazole, posaconazole, and itraconazole -certain medicines for irregular heart beat like flecainide, quinidine, propafenone -certain medicines for migraine headaches like almotriptan, eletriptan, frovatriptan,  naratriptan, rizatriptan, sumatriptan, zolmitriptan -certain medicines for sleep -certain medicines for seizures like carbamazepine, valproic acid, phenytoin -certain medicines that treat or prevent blood clots like warfarin, enoxaparin, dalteparin -cimetidine -digoxin -diuretics -fentanyl -isoniazid -lithium -NSAIDs, medicines for pain and inflammation, like ibuprofen or naproxen -other medicines that prolong the QT interval (cause an abnormal heart rhythm) -rasagiline -safinamide -supplements like St. John's wort, kava kava, valerian -tolbutamide -tramadol -tryptophan This list may not describe all possible interactions. Give your health care provider a list of all the medicines, herbs, non-prescription drugs, or dietary supplements you use. Also tell them if you smoke, drink alcohol, or use illegal drugs. Some items may interact with your medicine. What should I watch for while using this medicine? Tell your doctor if your symptoms do not get better or if they get worse. Visit your doctor or health care professional for regular checks on your progress. Because it may take several weeks to see the full effects of this medicine, it is important to continue your treatment as prescribed by your doctor. Patients and their families should watch out for new or worsening thoughts of suicide or depression. Also watch out for sudden changes in feelings such as feeling anxious, agitated, panicky, irritable, hostile, aggressive, impulsive, severely restless, overly excited and hyperactive, or not being able to sleep. If this happens, especially at the beginning of treatment or after a change in dose, call your health care professional. Bonita Quin may get drowsy or dizzy. Do not drive, use machinery, or do anything that needs mental alertness until you know how this medicine affects you. Do not stand or sit up quickly, especially if you are an older patient. This reduces the risk of dizzy or fainting spells.  Alcohol may interfere with the effect of this medicine. Avoid alcoholic drinks. Your mouth may get dry. Chewing sugarless gum or sucking hard candy, and drinking plenty of water may help. Contact your doctor if the problem does not go away or is severe. What side effects may I notice from receiving this medicine? Side effects that you should report to your doctor or health care professional as soon as possible: -allergic reactions like skin rash, itching or hives, swelling of the face, lips, or tongue -anxious -black, tarry stools -changes in vision -confusion -elevated mood, decreased need for sleep, racing thoughts, impulsive behavior -eye pain -fast, irregular heartbeat -feeling faint or lightheaded, falls -feeling agitated, angry, or irritable -hallucination, loss of contact with reality -loss of balance or coordination -loss of memory -painful or prolonged erections -restlessness, pacing, inability to keep [place] -seizures -stiff muscles -suicidal thoughts or other mood changes -trouble sleeping -unusual bleeding or bruising -unusually weak or tired -vomiting Side effects that usually do not require medical attention (report to your doctor or health care professional if they continue or are bothersome): -change in appetite or weight -change in sex drive or performance -diarrhea -increased sweating -indigestion, nausea -tremors This list  may not describe all possible side effects. Call your doctor for medical advice about side effects. You may report side effects to FDA at 1-800-FDA-1088. Where should I keep my medicine? Keep out of the reach of children. Store at room temperature between 15 and 30 degrees C (59 and 86 degrees F). Throw away any unused medicine after the expiration date. NOTE: This sheet is a summary. It may not cover all possible information. If you have questions about this medicine, talk to your doctor, pharmacist, or health care provider.  2018  Elsevier/Gold Standard (2016-11-27 14:17:49)   Panic Attacks Panic attacks are sudden, short-livedsurges of severe anxiety, fear, or discomfort. They may occur for no reason when you are relaxed, when you are anxious, or when you are sleeping. Panic attacks may occur for a number of reasons:  Healthy people occasionally have panic attacks in extreme, life-threatening situations, such as war or natural disasters. Normal anxiety is a protective mechanism of the body that helps [place] react to danger (fight or flight response).  Panic attacks are often seen with anxiety disorders, such as panic disorder, social anxiety disorder, generalized anxiety disorder, and phobias. Anxiety disorders cause excessive or uncontrollable anxiety. They may interfere with your relationships or other life activities.  Panic attacks are sometimes seen with other mental illnesses, such as depression and posttraumatic stress disorder.  Certain medical conditions, prescription medicines, and drugs of abuse can cause panic attacks. What are the signs or symptoms? Panic attacks start suddenly, peak within 20 minutes, and are accompanied by four or more of the following symptoms:  Pounding heart or fast heart rate (palpitations).  Sweating.  Trembling or shaking.  Shortness of breath or feeling smothered.  Feeling choked.  Chest pain or discomfort.  Nausea or strange feeling in your stomach.  Dizziness, light-headedness, or feeling like you will faint.  Chills or hot flushes.  Numbness or tingling in your lips or hands and feet.  Feeling that things are not real or feeling that you are not yourself.  Fear of losing control or going crazy.  Fear of dying. Some of these symptoms can mimic serious medical conditions. For example, you may think you are having a heart attack. Although panic attacks can be very scary, they are not life threatening. How is this diagnosed? Panic attacks are diagnosed through an  assessment by your health care provider. Your health care provider will ask questions about your symptoms, such as where and when they occurred. Your health care provider will also ask about your medical history and use of alcohol and drugs, including prescription medicines. Your health care provider may order blood tests or other studies to rule out a serious medical condition. Your health care provider may refer you to a mental health professional for further evaluation. How is this treated?  Most healthy people who have one or two panic attacks in an extreme, life-threatening situation will not require treatment.  The treatment for panic attacks associated with anxiety disorders or other mental illness typically involves counseling with a mental health professional, medicine, or a combination of both. Your health care provider will help determine what treatment is best for you.  Panic attacks due to physical illness usually go away with treatment of the illness. If prescription medicine is causing panic attacks, talk with your health care provider about stopping the medicine, decreasing the dose, or substituting another medicine.  Panic attacks due to alcohol or drug abuse go away with abstinence. Some adults need professional help  in order to stop drinking or using drugs. Follow these instructions at home:  Take all medicines as directed by your health care provider.  Schedule and attend follow-up visits as directed by your health care provider. It is important to keep all your appointments. Contact a health care provider if:  You are not able to take your medicines as prescribed.  Your symptoms do not improve or get worse. Get help right away if:  You experience panic attack symptoms that are different than your usual symptoms.  You have serious thoughts about hurting yourself or others.  You are taking medicine for panic attacks and have a serious side effect. This information is not  intended to replace advice given to you by your health care provider. Make sure you discuss any questions you have with your health care provider. Document Released: 11/23/2005 Document Revised: 04/30/2016 Document Reviewed: 07/07/2013 Elsevier Interactive Patient Education  2017 [employer].

## 2017-03-24 NOTE — Progress Notes (Signed)
Mason Rangel is a 51 y.o. male who presents to [name] today for anxiety and chest pressure.  Patient notes a several day or week history of occasional worsening anxiety. This is associated with episodes of dramatically increased anxiety lasting about 15 minutes associated with chest pressure. He denies palpitations or syncope. He notes that he is able to exercise regularly without any chest pain or pressure. He notes he's had increased stress in his life recently both at home and at work and he thinks this is a source of his anxiety. He also notes that he had a relationship to broke up recently. He notes that he sees a therapist once monthly for mental health but denies any diagnosis of severe anxiety or panic attacks previously. He notes the anxiety has worsened to the point where he's having to miss work.   Lastly he notes his blood pressure has been increased recently.  Past Medical History:  Diagnosis Date  . Gout    Past Surgical History:  Procedure Laterality Date  . upper endoscopy with dilation  2009   Social History  Substance Use Topics  . Smoking status: Never Smoker  . Smokeless tobacco: Not on file  . Alcohol use Yes   family history includes ALS in his father; Heart attack in his paternal grandfather and paternal grandmother; Hypertension in his mother; Stroke in his maternal grandfather.  ROS as above:  Medications: Current Outpatient Prescriptions  Medication Sig Dispense Refill  . dexlansoprazole (DEXILANT) 60 MG capsule Take 1 capsule (60 mg total) by mouth daily. 30 capsule 11  . fluticasone (FLONASE) 50 MCG/ACT nasal spray Place 2 sprays into both nostrils daily. 48 g 4  . metoprolol succinate (TOPROL-XL) 50 MG 24 hr tablet Take 1 tablet (50 mg total) by mouth daily. 90 tablet 1  . testosterone cypionate (DEPOTESTOTERONE CYPIONATE)  100 MG/ML injection Inject into the muscle every 7 (seven) days. For IM use only    . traZODone (DESYREL) 50 MG tablet Take 1-2 tablets (50-100 mg total) by mouth at bedtime as needed for sleep. 90 tablet 3  . valACYclovir (VALTREX) 1000 MG tablet Take 1 tablet (1,000 mg total) by mouth daily. 90 tablet 0  . zolpidem (AMBIEN) 10 MG tablet Take 1 tablet (10 mg total) by mouth at bedtime as needed for sleep. 90 tablet 3  . clonazePAM (KLONOPIN) 0.5 MG tablet Take 1 tablet (0.5 mg total) by mouth 2 (two) times daily. 60 tablet 1  . lisinopril (PRINIVIL,ZESTRIL) 10 MG tablet Take 1 tablet (10 mg total) by mouth daily. 30 tablet 1  . sertraline (ZOLOFT) 25 MG tablet Take 1 pill po daily for 1 week then increase to 2 pill po daily. Recheck 2 weeks. 30 tablet 2   No current facility-administered medications for this visit.    Allergies  Allergen Reactions  . Lorazepam Other (See Comments)    Severe nightmares  . Morphine And Related     Health Maintenance Health Maintenance  Topic Date Due  . HIV Screening  10/19/2017 (Originally 02/18/1981)  . INFLUENZA VACCINE  07/07/2017  . TETANUS/TDAP  05/22/2024  . COLONOSCOPY  12/07/2024     Exam:  BP (!) 160/92   Pulse 60   Wt 217 lb (98.4 kg)   SpO2 100%   BMI 29.43 kg/m  Gen: Well NAD nontoxic appearing HEENT: EOMI,  MMM Lungs: Normal work of breathing. CTABL Heart: RRR no MRG Abd:  NABS, Soft. Nondistended, Nontender Exts: Brisk capillary refill, warm and well perfused.  Psych alert and oriented normal speech thought process and affect. No SI or HI expressed.  Depression screen PHQ 2/9 03/24/2017  Decreased Interest 2  Down, Depressed, Hopeless 2  PHQ - 2 Score 4  Altered sleeping 0  Tired, decreased energy 1  Change in appetite 1  Feeling bad or failure about yourself  1  Trouble concentrating 1  Moving slowly or fidgety/restless 0  Suicidal thoughts 0  PHQ-9 Score 8    GAD 7 : Generalized Anxiety Score 03/24/2017  Nervous,  Anxious, on Edge 1  Control/stop worrying 1  Worry too much - different things 1  Trouble relaxing 1  Restless 1  Easily annoyed or irritable 1  Afraid - awful might happen 1  Total GAD 7 Score 7  Anxiety Difficulty Somewhat difficult    12 lead EKG: Normal sinus rhythm at 63 beats per minute. Left axis deviation. Early repolarization present in the lateral precordial leads.    No results found for this or any previous visit (from the past 72 hour(s)). No results found.    Assessment and Plan: 51 y.o. male with  Anxiety: Patient has what seems to be panic attacks. Plan to treat with both Zoloft as well as short-term clonazepam. He notes he has used clonazepam in the past and it has worked well for him. Plan to start Zoloft 25 mg titrating up to 100 mg. Recheck in about 2 weeks.  Chest symptoms: Patient has some chest tightness. These appear to be nonexertional and I think are probably related to his panic. However he is a 51 year old male with hypertension. I think it's worthwhile that he has a bit more workup. His EKG today showed some early repolarization and left axis deviation. Plan for echocardiogram in the near future.  Hypertension: Patient has had hypertension now for some time. He takes metoprolol which I think at this point is not working as well as it should. Plan continue with Toprol and start lisinopril 10 mg. Recheck in 2 weeks. He'll likely need further antihypertensives.   Orders Placed This Encounter  Procedures  . EKG 12-Lead  . ECHOCARDIOGRAM COMPLETE    Standing Status:   Future    Standing Expiration Date:   06/23/2018    Order Specific Question:   Where should this test be performed    Answer:   [facility]    Order Specific Question:   Complete or Limited study?    Answer:   Complete    Order Specific Question:   Does the patient have a known history of hypersensitivity to Perflutren (aka Hospital doctor for echocardiograms - CHECK  ALLERGIES)    Answer:   No    Order Specific Question:   ADMINISTER PERFLUTERN    Answer:   ADMINISTER PERFLUTREN    Order Specific Question:   Expected Date:    Answer:   1 month    Order Specific Question:   Reason for exam-Echo    Answer:   Abnormal ECG  794.31 / R94.31   Meds ordered this encounter  Medications  . clonazePAM (KLONOPIN) 0.5 MG tablet    Sig: Take 1 tablet (0.5 mg total) by mouth 2 (two) times daily.    Dispense:  60 tablet    Refill:  1  . sertraline (ZOLOFT) 25 MG tablet    Sig: Take 1 pill po daily for 1 week then increase to 2 pill  po daily. Recheck 2 weeks.    Dispense:  30 tablet    Refill:  2  . lisinopril (PRINIVIL,ZESTRIL) 10 MG tablet    Sig: Take 1 tablet (10 mg total) by mouth daily.    Dispense:  30 tablet    Refill:  1     Discussed warning signs or symptoms. Please see discharge instructions. Patient expresses understanding.  I spent 40 minutes with this patient, greater than 50% was face-to-face time counseling regarding the above diagnosis.

## 2017-03-30 ENCOUNTER — Ambulatory Visit (INDEPENDENT_AMBULATORY_CARE_PROVIDER_SITE_OTHER): Payer: 59 | Admitting: Physician Assistant

## 2017-03-30 ENCOUNTER — Encounter: Payer: Self-pay | Admitting: Physician Assistant

## 2017-03-30 VITALS — BP 158/95 | HR 77 | Ht 72.0 in | Wt 224.0 lb

## 2017-03-30 DIAGNOSIS — I10 Essential (primary) hypertension: Secondary | ICD-10-CM

## 2017-03-30 DIAGNOSIS — Z113 Encounter for screening for infections with a predominantly sexual mode of transmission: Secondary | ICD-10-CM

## 2017-03-30 DIAGNOSIS — F4323 Adjustment disorder with mixed anxiety and depressed mood: Secondary | ICD-10-CM | POA: Diagnosis not present

## 2017-03-30 DIAGNOSIS — E291 Testicular hypofunction: Secondary | ICD-10-CM | POA: Diagnosis not present

## 2017-03-30 LAB — COMPLETE METABOLIC PANEL WITH GFR
ALT: 13 U/L (ref 9–46)
AST: 17 U/L (ref 10–35)
Albumin: 3.8 g/dL (ref 3.6–5.1)
Alkaline Phosphatase: 56 U/L (ref 40–115)
BUN: 13 mg/dL (ref 7–25)
CO2: 20 mmol/L (ref 20–31)
Calcium: 9.2 mg/dL (ref 8.6–10.3)
Chloride: 103 mmol/L (ref 98–110)
Creat: 1.04 mg/dL (ref 0.70–1.33)
GFR, Est African American: >89 mL/min (ref >=60)
GFR, Est Non African American: 82 mL/min (ref >=60)
Glucose, Bld: 98 mg/dL (ref 65–99)
Potassium: 4.1 mmol/L (ref 3.5–5.3)
Sodium: 140 mmol/L (ref 135–146)
Total Bilirubin: 0.3 mg/dL (ref 0.2–1.2)
Total Protein: 6.7 g/dL (ref 6.1–8.1)

## 2017-03-30 LAB — TSH: TSH: 1.23 mIU/L (ref 0.40–4.50)

## 2017-03-30 MED ORDER — AMLODIPINE BESYLATE 5 MG PO TABS: 5.0000 mg | ORAL_TABLET | Freq: Every day | ORAL | 1 refills | Status: DC

## 2017-03-30 NOTE — Progress Notes (Signed)
   Subjective:    Patient ID: Mason Rangel, male    DOB: 1966-10-28, 51 y.o.   MRN: [id number]  HPI  Pt is a 51 yo male with HTN and adjustment disorder in his PMH comes in to follow up on his anxiety and uncontrolled HTN. He has a cuff and taking at home and top number is 200 over 110 at times. He admits to chest tightness. He recently went through a break up and feels like that is what has sent him into this spiral. He is in a same sex relationship and would like STD testing.  He is having panic attacks where he gets hot, sweaty, SOB all of a sudden. Last week started on zoloft, lisinopril . He has been increased to  over the phone. He is sleeping pretty well. He sees a therapist every 4 weeks.   Never been called about echo.    Review of Systems See HPI.     Objective:   Physical Exam  Constitutional: He appears well-developed and well-nourished.  HENT:  Head: Normocephalic and atraumatic.  Cardiovascular: Normal rate, regular rhythm and normal heart sounds.   Pulmonary/Chest: Effort normal and breath sounds normal.  Psychiatric: He has a normal mood and affect. His behavior is normal.          Assessment & Plan:  [name] KitchenMarland KitchenHridhaan was seen today for hypertension and anxiety.  Diagnoses and all orders for this visit:  Screening examination for STD (sexually transmitted disease) -     HIV antibody (with reflex) -     RPR -     HSV(herpes smplx)abs-1+2(IgG+IgM)-bld  Hypertension, uncontrolled -     COMPLETE METABOLIC PANEL WITH GFR -     amLODipine (NORVASC) 5 MG tablet; Take 1 tablet (5 mg total) by mouth daily. -     TSH  Hypogonadism in male  Adjustment disorder with mixed anxiety and depressed mood   Increase to  of zoloft now. Ok to take up to whole tablet of rescue klonapin when having panic attacks. Discussed abuse potential. Continue with counseling.   Pt request STD panel.   Added norvasc to lisinopril  and metoprolol. Follow up in 2 weeks.  Resubmitted echo to be scheduled.   Written out of work for 2 weeks until BP is controlled and mood has improved.

## 2017-03-30 NOTE — Patient Instructions (Addendum)
Increase to  of zoloft. klonapin as needed up to 2 twice a day.  Recheck BP 2 weeks.  Stay on metoprolol, lisinopril, norvasc.

## 2017-03-31 LAB — RPR

## 2017-03-31 LAB — HIV ANTIBODY (ROUTINE TESTING W REFLEX): HIV 1&2 Ab, 4th Generation: NONREACTIVE

## 2017-04-02 ENCOUNTER — Other Ambulatory Visit: Payer: Self-pay | Admitting: Sports Medicine

## 2017-04-05 ENCOUNTER — Ambulatory Visit: Payer: 59 | Admitting: Physician Assistant

## 2017-04-05 ENCOUNTER — Other Ambulatory Visit: Payer: Self-pay | Admitting: *Deleted

## 2017-04-05 MED ORDER — LISINOPRIL 40 MG PO TABS: 40.0000 mg | ORAL_TABLET | Freq: Every day | ORAL | 0 refills | Status: DC

## 2017-04-05 MED ORDER — VALACYCLOVIR HCL 1 G PO TABS: 1000.0000 mg | ORAL_TABLET | Freq: Every day | ORAL | 0 refills | Status: DC

## 2017-04-08 ENCOUNTER — Other Ambulatory Visit (HOSPITAL_BASED_OUTPATIENT_CLINIC_OR_DEPARTMENT_OTHER): Payer: 59

## 2017-04-12 ENCOUNTER — Inpatient Hospital Stay (HOSPITAL_COMMUNITY): Admission: RE | Admit: 2017-04-12 | Payer: 59 | Source: Ambulatory Visit

## 2017-04-13 ENCOUNTER — Ambulatory Visit: Payer: 59 | Admitting: Physician Assistant

## 2017-04-15 ENCOUNTER — Other Ambulatory Visit (HOSPITAL_BASED_OUTPATIENT_CLINIC_OR_DEPARTMENT_OTHER): Payer: 59

## 2017-04-19 ENCOUNTER — Ambulatory Visit (INDEPENDENT_AMBULATORY_CARE_PROVIDER_SITE_OTHER): Payer: 59 | Admitting: Physician Assistant

## 2017-04-19 ENCOUNTER — Encounter: Payer: Self-pay | Admitting: Physician Assistant

## 2017-04-19 VITALS — BP 118/67 | HR 74 | Ht 72.0 in | Wt 214.0 lb

## 2017-04-19 DIAGNOSIS — I10 Essential (primary) hypertension: Secondary | ICD-10-CM

## 2017-04-19 DIAGNOSIS — Z79899 Other long term (current) drug therapy: Secondary | ICD-10-CM | POA: Diagnosis not present

## 2017-04-19 MED ORDER — SERTRALINE HCL 50 MG PO TABS: 50.0000 mg | ORAL_TABLET | Freq: Every day | ORAL | 1 refills | Status: DC

## 2017-04-19 MED ORDER — AMLODIPINE BESYLATE 5 MG PO TABS: 5.0000 mg | ORAL_TABLET | Freq: Every day | ORAL | 1 refills | Status: DC

## 2017-04-19 MED ORDER — METOPROLOL SUCCINATE ER 50 MG PO TB24: 50.0000 mg | ORAL_TABLET | Freq: Every day | ORAL | 1 refills | Status: DC

## 2017-04-19 MED ORDER — LISINOPRIL 40 MG PO TABS: 40.0000 mg | ORAL_TABLET | Freq: Every day | ORAL | 1 refills | Status: DC

## 2017-04-19 NOTE — Progress Notes (Signed)
   Subjective:    Patient ID: Mason Rangel, male    DOB: 1965/12/28, 51 y.o.   MRN: [id number]  HPI  Pt is a 51 yo male who presents to the clinic to follow up on HTN and anxiety and depression.   HTN- doing great. He has stopped checking BP because he feels like always reading higher than reads in office. He denies any CP, palpitations, headaches, vision changes. He [facility] does not have echo scheduled. He got order switched to [facility]. He would like us to call and see if we can get scheduled.   His mood is much better. He continues to go to counselor regularly. He is taking zoloft 50mg  daily. He is [place] struggling with work stress some but is more more clear headed. He knows he needs to make some changes in his life.   Review of Systems  All other systems reviewed and are negative.      Objective:   Physical Exam  Constitutional: He is oriented to person, place, and time. He appears well-developed and well-nourished.  HENT:  Head: Normocephalic and atraumatic.  Cardiovascular: Normal rate, regular rhythm and normal heart sounds.   Pulmonary/Chest: Effort normal and breath sounds normal.  Neurological: He is alert and oriented to person, place, and time.  Psychiatric: He has a normal mood and affect. His behavior is normal.          Assessment & Plan:  [name] Kitchen.[name] Kitchen.Mason Rangel was seen today for hypertension.  Diagnoses and all orders for this visit:  Medication management -     COMPLETE METABOLIC PANEL WITH GFR  Hypertension, uncontrolled -     amLODipine (NORVASC) 5 MG tablet; Take 1 tablet (5 mg total) by mouth daily.  Essential hypertension, benign -     COMPLETE METABOLIC PANEL WITH GFR -     metoprolol succinate (TOPROL-XL) 50 MG 24 hr tablet; Take 1 tablet (50 mg total) by mouth daily.  Other orders -     sertraline (ZOLOFT) 50 MG tablet; Take 1 tablet (50 mg total) by mouth daily. -     lisinopril (PRINIVIL,ZESTRIL) 40 MG tablet; Take 1 tablet (40 mg total) by mouth  daily.  BP looks great. Refilled for 6 months.   Depression and anxiety much better. Refilled zoloft for 6 months. Continue with counseling.

## 2017-04-20 LAB — COMPLETE METABOLIC PANEL WITH GFR
ALT: 15 U/L (ref 9–46)
AST: 18 U/L (ref 10–35)
Albumin: 3.9 g/dL (ref 3.6–5.1)
Alkaline Phosphatase: 52 U/L (ref 40–115)
BUN: 20 mg/dL (ref 7–25)
CO2: 26 mmol/L (ref 20–31)
Calcium: 8.9 mg/dL (ref 8.6–10.3)
Chloride: 102 mmol/L (ref 98–110)
Creat: 1.44 mg/dL — ABNORMAL HIGH (ref 0.70–1.33)
GFR, Est African American: 64 mL/min (ref >=60)
GFR, Est Non African American: 55 mL/min — ABNORMAL LOW (ref >=60)
Glucose, Bld: 89 mg/dL (ref 65–99)
Potassium: 4.5 mmol/L (ref 3.5–5.3)
Sodium: 135 mmol/L (ref 135–146)
Total Bilirubin: 0.4 mg/dL (ref 0.2–1.2)
Total Protein: 6.6 g/dL (ref 6.1–8.1)

## 2017-04-23 ENCOUNTER — Other Ambulatory Visit: Payer: Self-pay | Admitting: Physician Assistant

## 2017-04-23 DIAGNOSIS — R7989 Other specified abnormal findings of blood chemistry: Secondary | ICD-10-CM

## 2017-04-23 DIAGNOSIS — I10 Essential (primary) hypertension: Secondary | ICD-10-CM

## 2017-04-23 NOTE — Progress Notes (Signed)
Ok placed order

## 2017-04-27 ENCOUNTER — Ambulatory Visit (HOSPITAL_COMMUNITY)
Admission: RE | Admit: 2017-04-27 | Discharge: 2017-04-27 | Disposition: A | Discharge status: Home / Self Care | Payer: 59 | Source: Ambulatory Visit | Attending: Family Medicine | Admitting: Family Medicine

## 2017-04-27 DIAGNOSIS — I517 Cardiomegaly: Secondary | ICD-10-CM | POA: Insufficient documentation

## 2017-04-27 DIAGNOSIS — R9431 Abnormal electrocardiogram [ECG] [EKG]: Secondary | ICD-10-CM | POA: Diagnosis not present

## 2017-04-27 NOTE — Progress Notes (Signed)
  Echocardiogram 2D Echocardiogram has been performed.  Arvil ChacoFoster, Maddison Kilner 04/27/2017, 10:59 AM

## 2017-04-28 ENCOUNTER — Encounter: Payer: Self-pay | Admitting: Family Medicine

## 2017-04-28 DIAGNOSIS — I5189 Other ill-defined heart diseases: Secondary | ICD-10-CM | POA: Insufficient documentation

## 2017-05-03 ENCOUNTER — Emergency Department (HOSPITAL_BASED_OUTPATIENT_CLINIC_OR_DEPARTMENT_OTHER)
Admission: EM | Admit: 2017-05-03 | Discharge: 2017-05-04 | Disposition: A | Discharge status: Home / Self Care | Payer: 59 | Attending: Emergency Medicine | Admitting: Emergency Medicine

## 2017-05-03 ENCOUNTER — Encounter (HOSPITAL_BASED_OUTPATIENT_CLINIC_OR_DEPARTMENT_OTHER): Payer: Self-pay | Admitting: Respiratory Therapy

## 2017-05-03 ENCOUNTER — Emergency Department (HOSPITAL_BASED_OUTPATIENT_CLINIC_OR_DEPARTMENT_OTHER): Payer: 59

## 2017-05-03 DIAGNOSIS — Z79899 Other long term (current) drug therapy: Secondary | ICD-10-CM | POA: Diagnosis not present

## 2017-05-03 DIAGNOSIS — F419 Anxiety disorder, unspecified: Secondary | ICD-10-CM | POA: Diagnosis not present

## 2017-05-03 DIAGNOSIS — R0789 Other chest pain: Secondary | ICD-10-CM | POA: Diagnosis present

## 2017-05-03 DIAGNOSIS — I10 Essential (primary) hypertension: Secondary | ICD-10-CM | POA: Insufficient documentation

## 2017-05-03 HISTORY — DX: Anxiety disorder, unspecified: F41.9

## 2017-05-03 HISTORY — DX: Essential (primary) hypertension: I10

## 2017-05-03 LAB — COMPREHENSIVE METABOLIC PANEL
ALT: 16 U/L — ABNORMAL LOW (ref 17–63)
AST: 22 U/L (ref 15–41)
Albumin: 3.7 g/dL (ref 3.5–5.0)
Alkaline Phosphatase: 58 U/L (ref 38–126)
Anion gap: 7 (ref 5–15)
BUN: 10 mg/dL (ref 6–20)
CO2: 26 mmol/L (ref 22–32)
Calcium: 8.6 mg/dL — ABNORMAL LOW (ref 8.9–10.3)
Chloride: 104 mmol/L (ref 101–111)
Creatinine, Ser: 1.13 mg/dL (ref 0.61–1.24)
GFR calc Af Amer: >60 mL/min (ref >60)
GFR calc non Af Amer: >60 mL/min (ref >60)
Glucose, Bld: 99 mg/dL (ref 65–99)
Potassium: 3.9 mmol/L (ref 3.5–5.1)
Sodium: 137 mmol/L (ref 135–145)
Total Bilirubin: 0.5 mg/dL (ref 0.3–1.2)
Total Protein: 6.5 g/dL (ref 6.5–8.1)

## 2017-05-03 LAB — CBC WITH DIFFERENTIAL/PLATELET
Basophils Absolute: 0.1 10*3/uL (ref 0.0–0.1)
Basophils Relative: 1 %
Eosinophils Absolute: 0.2 10*3/uL (ref 0.0–0.7)
Eosinophils Relative: 2 %
HCT: 37.4 % — ABNORMAL LOW (ref 39.0–52.0)
Hemoglobin: 13.5 g/dL (ref 13.0–17.0)
Lymphocytes Relative: 34 %
Lymphs Abs: 2.4 10*3/uL (ref 0.7–4.0)
MCH: 31.6 pg (ref 26.0–34.0)
MCHC: 36.1 g/dL — ABNORMAL HIGH (ref 30.0–36.0)
MCV: 87.6 fL (ref 78.0–100.0)
Monocytes Absolute: 0.8 10*3/uL (ref 0.1–1.0)
Monocytes Relative: 12 %
Neutro Abs: 3.6 10*3/uL (ref 1.7–7.7)
Neutrophils Relative %: 51 %
Platelets: 199 10*3/uL (ref 150–400)
RBC: 4.27 MIL/uL (ref 4.22–5.81)
RDW: 13.1 % (ref 11.5–15.5)
WBC: 7 10*3/uL (ref 4.0–10.5)

## 2017-05-03 LAB — TROPONIN I: Troponin I: <0.03 ng/mL (ref <0.03)

## 2017-05-03 NOTE — ED Notes (Signed)
Patient transported to X-ray 

## 2017-05-03 NOTE — ED Triage Notes (Signed)
CP x 2 hours-NAD-steady gait 

## 2017-05-04 LAB — TROPONIN I: Troponin I: <0.03 ng/mL (ref <0.03)

## 2017-05-04 NOTE — ED Notes (Signed)
ED Provider at bedside. 

## 2017-05-04 NOTE — ED Provider Notes (Signed)
[facility] Provider Note: Lowella Dell, MD, FACEP  CSN: [id number] MRN: [id number] ARRIVAL: 05/03/17 at 2257 ROOM: [facility]06/[facility]   CHIEF COMPLAINT  Chest Pain   HISTORY OF PRESENT ILLNESS  Mason Rangel is a 51 y.o. male who has been having "anxiety attacks" intermittently for about the past 6 weeks. He has been evaluated for this and had an unremarkable echocardiogram on the 22nd of this month. He is on medication for blood pressure as well as anxiety. He is here with another attack that occurred about 8:30 PM yesterday evening. There was no specific triggering event. He describes the symptoms as pressure in his anterior chest which he rated as a 5 out of 10, hyperventilation, anxiety with a sense of impending doom, and racing thoughts. He denies diaphoresis, nausea or vomiting. He took his usual evening medications and attempted to sleep but was not able to. His symptoms resolved after about 2 hours. He is asymptomatic now except for continued racing thoughts which has improved.   Past Medical History:  Diagnosis Date  . Anxiety   . Gout   . Hypertension     Past Surgical History:  Procedure Laterality Date  . ESOPHAGEAL DILATION    . upper endoscopy with dilation  2009    Family History  Problem Relation Age of Onset  . Hypertension Mother   . ALS Father   . Stroke Maternal Grandfather   . Heart attack Paternal Grandmother   . Heart attack Paternal Grandfather     Social History  Substance Use Topics  . Smoking status: Never Smoker  . Smokeless tobacco: Never Used  . Alcohol use Yes     Comment: occ    Prior to Admission medications   Medication Sig Start Date End Date Taking? Authorizing Provider  amLODipine (NORVASC) 5 MG tablet Take 1 tablet (5 mg total) by mouth daily. 04/19/17   Breeback, Jade L, PA-C  clonazePAM (KLONOPIN) 0.5 MG tablet Take 1 tablet (0.5 mg total) by mouth 2 (two) times daily. 03/24/17   Rodolph Bong, MD  dexlansoprazole  (DEXILANT) 60 MG capsule Take 1 capsule (60 mg total) by mouth daily. 10/21/16   Breeback, Jade L, PA-C  fluticasone (FLONASE) 50 MCG/ACT nasal spray Place 2 sprays into both nostrils daily. 06/02/16   Breeback, Jade L, PA-C  lisinopril (PRINIVIL,ZESTRIL) 40 MG tablet Take 1 tablet (40 mg total) by mouth daily. 04/19/17   Breeback, Lesly Rubenstein L, PA-C  metoprolol succinate (TOPROL-XL) 50 MG 24 hr tablet Take 1 tablet (50 mg total) by mouth daily. 04/19/17   Breeback, Jade L, PA-C  sertraline (ZOLOFT) 50 MG tablet Take 1 tablet (50 mg total) by mouth daily. 04/19/17   Breeback, Lesly Rubenstein L, PA-C  testosterone cypionate (DEPOTESTOTERONE CYPIONATE) 100 MG/ML injection Inject into the muscle every 7 (seven) days. For IM use only    [provider]  traZODone (DESYREL) 50 MG tablet Take 1-2 tablets (50-100 mg total) by mouth at bedtime as needed for sleep. 10/19/16   Breeback, Jade L, PA-C  valACYclovir (VALTREX) 1000 MG tablet Take 1 tablet (1,000 mg total) by mouth daily. 04/05/17   Breeback, Jade L, PA-C  zolpidem (AMBIEN) 10 MG tablet Take 1 tablet (10 mg total) by mouth at bedtime as needed for sleep. 10/19/16   Tandy Gaw L, PA-C    Allergies Lorazepam and Morphine and related   REVIEW OF SYSTEMS  Negative except as noted here or in the History of Present Illness.  PHYSICAL EXAMINATION  Initial Vital Signs Blood pressure (!) 156/107, pulse 71, temperature 98.5 F (36.9 C), temperature source Oral, resp. rate 14, height 6' (1.829 m), weight 97.1 kg (214 lb), SpO2 99 %.  Examination General: Well-developed, well-nourished male in no acute distress; appearance consistent with age of record HENT: normocephalic; atraumatic Eyes: pupils equal, round and reactive to light; extraocular muscles intact Neck: supple Heart: regular rate and rhythm; no murmur Lungs: clear to auscultation bilaterally Abdomen: soft; nondistended; nontender; bowel sounds present Extremities: No deformity; full range of  motion; pulses normal Neurologic: Awake, alert and oriented; motor function intact in all extremities and symmetric; no facial droop Skin: Warm and dry Psychiatric: Normal mood and affect   RESULTS  Summary of this visit's results, reviewed by myself:   EKG Interpretation  Date/Time:  Monday May 03 2017 23:03:00 EDT Ventricular Rate:  76 PR Interval:  168 QRS Duration: 102 QT Interval:  368 QTC Calculation: 414 R Axis:   -36 Text Interpretation:  Normal sinus rhythm Left axis deviation Minimal voltage criteria for LVH, may be normal variant Abnormal ECG No previous ECGs available Confirmed by Desha Bitner, Jonny RuizJohn ([id number]) on 05/03/2017 11:26:48 PM      Laboratory Studies: Results for orders placed or performed during the hospital encounter of 05/03/17 (from the past 24 hour(s))  Comprehensive metabolic panel     Status: Abnormal   Collection Time: 05/03/17 11:21 PM  Result Value Ref Range   Sodium 137 135 - 145 mmol/L   Potassium 3.9 3.5 - 5.1 mmol/L   Chloride 104 101 - 111 mmol/L   CO2 26 22 - 32 mmol/L   Glucose, Bld 99 65 - 99 mg/dL   BUN 10 6 - 20 mg/dL   Creatinine, Ser 6.041.13 0.61 - 1.24 mg/dL   Calcium 8.6 (L) 8.9 - 10.3 mg/dL   Total Protein 6.5 6.5 - 8.1 g/dL   Albumin 3.7 3.5 - 5.0 g/dL   AST 22 15 - 41 U/L   ALT 16 (L) 17 - 63 U/L   Alkaline Phosphatase 58 38 - 126 U/L   Total Bilirubin 0.5 0.3 - 1.2 mg/dL   GFR calc non Af Amer >60 >60 mL/min   GFR calc Af Amer >60 >60 mL/min   Anion gap 7 5 - 15  CBC with Differential     Status: Abnormal   Collection Time: 05/03/17 11:21 PM  Result Value Ref Range   WBC 7.0 4.0 - 10.5 K/uL   RBC 4.27 4.22 - 5.81 MIL/uL   Hemoglobin 13.5 13.0 - 17.0 g/dL   HCT 54.037.4 (L) 98.139.0 - 19.152.0 %   MCV 87.6 78.0 - 100.0 fL   MCH 31.6 26.0 - 34.0 pg   MCHC 36.1 (H) 30.0 - 36.0 g/dL   RDW 47.813.1 29.511.5 - 62.115.5 %   Platelets 199 150 - 400 K/uL   Neutrophils Relative % 51 %   Neutro Abs 3.6 1.7 - 7.7 K/uL   Lymphocytes Relative 34 %   Lymphs Abs  2.4 0.7 - 4.0 K/uL   Monocytes Relative 12 %   Monocytes Absolute 0.8 0.1 - 1.0 K/uL   Eosinophils Relative 2 %   Eosinophils Absolute 0.2 0.0 - 0.7 K/uL   Basophils Relative 1 %   Basophils Absolute 0.1 0.0 - 0.1 K/uL  Troponin I     Status: None   Collection Time: 05/03/17 11:21 PM  Result Value Ref Range   Troponin I <0.03 <0.03 ng/mL  Troponin I  Status: None   Collection Time: 05/04/17  1:43 AM  Result Value Ref Range   Troponin I <0.03 <0.03 ng/mL   Imaging Studies: Dg Chest 2 View  Result Date: 05/03/2017 CLINICAL DATA:  51 year old male with central chest pain EXAM: CHEST  2 VIEW COMPARISON:  None. FINDINGS: The lungs are clear. There is no pleural effusion or pneumothorax. The cardiac silhouette is within normal limits. No acute osseous pathology. IMPRESSION: No active cardiopulmonary disease. Electronically Signed   By: Elgie Collard M.D.   On: 05/03/2017 23:43    ED COURSE  Nursing notes and initial vitals signs, including pulse oximetry, reviewed.  Vitals:   05/03/17 2305 05/03/17 2306 05/03/17 2334 05/04/17 0000  BP:  (!) 156/98 (!) 156/107 (!) 152/100  Pulse:  73 71 71  Resp:  20 14 14   Temp:  98.5 F (36.9 C)    TempSrc:  Oral    SpO2:  100% 99% 97%  Weight: 97.1 kg (214 lb)     Height: 6' (1.829 m)       PROCEDURES    ED DIAGNOSES     ICD-9-CM ICD-10-CM   1. Anxiety 300.00 F41.9        Keelon Zurn, Jonny Ruiz, MD 05/04/17 [id number]

## 2017-05-07 ENCOUNTER — Telehealth: Payer: Self-pay | Admitting: Family Medicine

## 2017-05-07 NOTE — Telephone Encounter (Signed)
[facility] called about this pt to let you know that the pt decided to cancel the appt and he will have the ultrasound done at [facility], [facility] said that you can contact them or pt if you have any questions.

## 2017-05-10 ENCOUNTER — Ambulatory Visit (HOSPITAL_COMMUNITY)
Admission: RE | Admit: 2017-05-10 | Discharge: 2017-05-10 | Disposition: A | Discharge status: Home / Self Care | Payer: 59 | Source: Ambulatory Visit | Attending: Surgery | Admitting: Surgery

## 2017-05-10 ENCOUNTER — Other Ambulatory Visit: Payer: Self-pay | Admitting: Physician Assistant

## 2017-05-10 DIAGNOSIS — R7989 Other specified abnormal findings of blood chemistry: Secondary | ICD-10-CM

## 2017-05-10 DIAGNOSIS — I10 Essential (primary) hypertension: Secondary | ICD-10-CM

## 2017-05-10 NOTE — Telephone Encounter (Signed)
Will you call patient and confirm he has appt with [facility] for u/s and see how his BP are doing?

## 2017-05-13 NOTE — Telephone Encounter (Signed)
Done

## 2017-06-02 MED ORDER — TRIAMCINOLONE ACETONIDE 0.1 % EX CREA: 1.0000 "application " | TOPICAL_CREAM | Freq: Two times a day (BID) | CUTANEOUS | 1 refills | Status: DC

## 2017-06-04 ENCOUNTER — Other Ambulatory Visit: Payer: Self-pay

## 2017-06-04 DIAGNOSIS — F5101 Primary insomnia: Secondary | ICD-10-CM

## 2017-06-04 MED ORDER — ZOLPIDEM TARTRATE 10 MG PO TABS: 10.0000 mg | ORAL_TABLET | Freq: Every evening | ORAL | 3 refills | Status: DC | PRN

## 2017-06-14 ENCOUNTER — Encounter (HOSPITAL_COMMUNITY): Payer: 59

## 2017-07-18 ENCOUNTER — Emergency Department (HOSPITAL_BASED_OUTPATIENT_CLINIC_OR_DEPARTMENT_OTHER)
Admission: EM | Admit: 2017-07-18 | Discharge: 2017-07-18 | Disposition: A | Discharge status: Home / Self Care | Payer: 59 | Attending: Emergency Medicine | Admitting: Emergency Medicine

## 2017-07-18 ENCOUNTER — Encounter (HOSPITAL_BASED_OUTPATIENT_CLINIC_OR_DEPARTMENT_OTHER): Payer: Self-pay | Admitting: Emergency Medicine

## 2017-07-18 DIAGNOSIS — K222 Esophageal obstruction: Secondary | ICD-10-CM | POA: Diagnosis not present

## 2017-07-18 DIAGNOSIS — R066 Hiccough: Secondary | ICD-10-CM | POA: Diagnosis present

## 2017-07-18 DIAGNOSIS — I10 Essential (primary) hypertension: Secondary | ICD-10-CM | POA: Insufficient documentation

## 2017-07-18 DIAGNOSIS — Z79899 Other long term (current) drug therapy: Secondary | ICD-10-CM | POA: Insufficient documentation

## 2017-07-18 MED ORDER — PANTOPRAZOLE SODIUM 40 MG IV SOLR
40.0000 mg | Freq: Once | INTRAVENOUS | Status: AC
  Administered 2017-07-18: 40 mg via INTRAVENOUS
  Filled 2017-07-18: qty 40

## 2017-07-18 MED ORDER — DIAZEPAM 5 MG/ML IJ SOLN
2.5000 mg | Freq: Once | INTRAMUSCULAR | Status: AC
  Administered 2017-07-18: 2.5 mg via INTRAVENOUS
  Filled 2017-07-18: qty 2

## 2017-07-18 MED ORDER — CHLORPROMAZINE HCL 25 MG PO TABS
25.0000 mg | ORAL_TABLET | Freq: Once | ORAL | Status: AC
  Administered 2017-07-18: 25 mg via ORAL
  Filled 2017-07-18: qty 1

## 2017-07-18 MED ORDER — CHLORPROMAZINE HCL 25 MG PO TABS: 25.0000 mg | ORAL_TABLET | Freq: Three times a day (TID) | ORAL | 0 refills | Status: DC

## 2017-07-18 NOTE — ED Triage Notes (Signed)
Patient states that he has had a "constriction" to his esophagus since last night that appears to this RN as hiccups. Has a history of same

## 2017-07-18 NOTE — ED Notes (Signed)
Given water and crackers. Hiccups resolved

## 2017-07-18 NOTE — ED Notes (Signed)
Noted to have hiccups

## 2017-07-18 NOTE — ED Provider Notes (Signed)
[facility] Provider Note   CSN: [id number] Arrival date & time: 07/18/17  1111     History   Chief Complaint Chief Complaint  Patient presents with  . Hiccups    HPI [name] is a 51 y.o. male.  HPI Patient has had persistent hiccups since last night. He reports this is because he has an esophageal stricture and usually needs dilation when this happens. He reports some mild hiccups, on if he takes extra doses of his PPI medications he is able to alleviate it. He reports he has not had any episode of anything getting stuck in the esophagus. He is not drooling or having difficulty swallowing. Past Medical History:  Diagnosis Date  . Anxiety   . Gout   . Hypertension     Patient Active Problem List   Diagnosis Date Noted  . Diastolic dysfunction 04/28/2017  . LVH (left ventricular hypertrophy) due to hypertensive disease, without heart failure 03/24/2017  . Hypogonadism in male 10/19/2016  . Swelling of foot joint 07/10/2016  . Right foot pain 07/10/2016  . Left sided chest pain 04/06/2016  . Anxiety 04/06/2016  . Insomnia 04/06/2016  . Adjustment disorder with mixed anxiety and depressed mood 04/06/2016  . Essential hypertension, benign 04/06/2016  . GERD (gastroesophageal reflux disease) 04/06/2016  . Esophageal stenosis 04/06/2016    Past Surgical History:  Procedure Laterality Date  . ESOPHAGEAL DILATION    . upper endoscopy with dilation  2009       Home Medications    Prior to Admission medications   Medication Sig Start Date End Date Taking? Authorizing Provider  amLODipine (NORVASC) 5 MG tablet Take 1 tablet (5 mg total) by mouth daily. 04/19/17   Breeback, Lonna Cobb, PA-C  chlorproMAZINE (THORAZINE) 25 MG tablet Take 1 tablet (25 mg total) by mouth 3 (three) times daily. 07/18/17   Arby Barrette, MD  clonazePAM (KLONOPIN) 0.5 MG tablet Take 1 tablet (0.5 mg total) by mouth 2 (two) times daily. 03/24/17   Rodolph Bong, MD    dexlansoprazole (DEXILANT) 60 MG capsule Take 1 capsule (60 mg total) by mouth daily. 10/21/16   Breeback, Jade L, PA-C  fluticasone (FLONASE) 50 MCG/ACT nasal spray Place 2 sprays into both nostrils daily. 06/02/16   Breeback, Jade L, PA-C  lisinopril (PRINIVIL,ZESTRIL) 40 MG tablet Take 1 tablet (40 mg total) by mouth daily. 04/19/17   Breeback, Lesly Rubenstein L, PA-C  metoprolol succinate (TOPROL-XL) 50 MG 24 hr tablet Take 1 tablet (50 mg total) by mouth daily. 04/19/17   Breeback, Jade L, PA-C  sertraline (ZOLOFT) 50 MG tablet Take 1 tablet (50 mg total) by mouth daily. 04/19/17   Breeback, Lesly Rubenstein L, PA-C  testosterone cypionate (DEPOTESTOTERONE CYPIONATE) 100 MG/ML injection Inject into the muscle every 7 (seven) days. For IM use only    [provider]  traZODone (DESYREL) 50 MG tablet Take 1-2 tablets (50-100 mg total) by mouth at bedtime as needed for sleep. 10/19/16   Breeback, Jade L, PA-C  triamcinolone cream (KENALOG) 0.1 % Apply 1 application topically 2 (two) times daily. 06/02/17   Breeback, Lonna Cobb, PA-C  valACYclovir (VALTREX) 1000 MG tablet Take 1 tablet (1,000 mg total) by mouth daily. 04/05/17   Breeback, Jade L, PA-C  zolpidem (AMBIEN) 10 MG tablet Take 1 tablet (10 mg total) by mouth at bedtime as needed for sleep. 06/04/17   Agapito Games, MD    Family History Family History  Problem Relation Age of Onset  .  Hypertension Mother   . ALS Father   . Stroke Maternal Grandfather   . Heart attack Paternal Grandmother   . Heart attack Paternal Grandfather     Social History Social History  Substance Use Topics  . Smoking status: Never Smoker  . Smokeless tobacco: Never Used  . Alcohol use Yes     Comment: occ     Allergies   Lorazepam and Morphine and related   Review of Systems Review of Systems 10 Systems reviewed and are negative for acute change except as noted in the HPI.   Physical Exam Updated Vital Signs BP 118/80 (BP Location: Right Arm)   Pulse 80    Temp 98.5 F (36.9 C) (Oral)   Resp 19   Ht 6' (1.829 m)   Wt 97.5 kg (215 lb)   SpO2 100%   BMI 29.16 kg/m   Physical Exam  Constitutional: He is oriented to person, place, and time. He appears well-developed and well-nourished. No distress.  The patient is having intermittent hiccups but it is not debilitating. He is speaking normally and breathing normally. Clinically well and appearance.  HENT:  Head: Normocephalic and atraumatic.  Nose: Nose normal.  Mouth/Throat: Oropharynx is clear and moist.  Eyes: EOM are normal.  Cardiovascular: Normal rate, regular rhythm and normal heart sounds.   Pulmonary/Chest: Effort normal and breath sounds normal.  Abdominal: Soft. He exhibits no distension. There is no tenderness. There is no guarding.  Musculoskeletal: Normal range of motion.  Neurological: He is alert and oriented to person, place, and time. No cranial nerve deficit. He exhibits normal muscle tone. Coordination normal.  Skin: Skin is warm and dry.  Psychiatric: He has a normal mood and affect.     ED Treatments / Results  Labs (all labs ordered are listed, but only abnormal results are displayed) Labs Reviewed - No data to display  EKG  EKG Interpretation None       Radiology No results found.  Procedures Procedures (including critical care time)  Medications Ordered in ED Medications  pantoprazole (PROTONIX) injection 40 mg (40 mg Intravenous Given 07/18/17 1300)  chlorproMAZINE (THORAZINE) tablet 25 mg (25 mg Oral Given 07/18/17 1256)  diazepam (VALIUM) injection 2.5 mg (2.5 mg Intravenous Given 07/18/17 1300)     Initial Impression / Assessment and Plan / ED Course  I have reviewed the triage vital signs and the nursing notes.  Pertinent labs & imaging results that were available during my care of the patient were reviewed by me and considered in my medical decision making (see chart for details).      Final Clinical Impressions(s) / ED Diagnoses    Final diagnoses:  Hiccups  Esophageal stricture   Patient reports that symptoms always start when he is developing a significant esophageal stricture. He however does not have any food impaction. He does not have any drooling. Speech is normal. I did advise the patient that it was possible he would not get any emergent upper endoscopy done today because he is not showing actual signs of obstruction. After left the room, he reports that his hiccups abruptly stop. He reports he thinks it is because he got distracted and kind of upset of the news that he may not get the endoscopy. He did wish to proceed with the medications I had planned for him, namely Thorazine, IV protonic and Valium. He did proceed with these and went on to tolerate crackers and water without difficulty. At this time, patient will follow-up  as an outpatient with gastroenterology for the esophageal stricture which has been recurrent for him. He has been given a prescription for chlorpromazine to use for hiccups if he chooses. New Prescriptions New Prescriptions   CHLORPROMAZINE (THORAZINE) 25 MG TABLET    Take 1 tablet (25 mg total) by mouth 3 (three) times daily.     Arby BarrettePfeiffer, Karandeep Resende, MD 07/18/17 1430

## 2017-07-25 IMAGING — DX DG ANKLE COMPLETE 3+V*R*
3 series · 3 of 3 positions shown · non-contrast
Comparison: None.

CLINICAL DATA: Right ankle and foot pain and swelling for the past
3 weeks. No known injury.

EXAM:
RIGHT ANKLE - COMPLETE 3+ VIEW

[ankle ap]
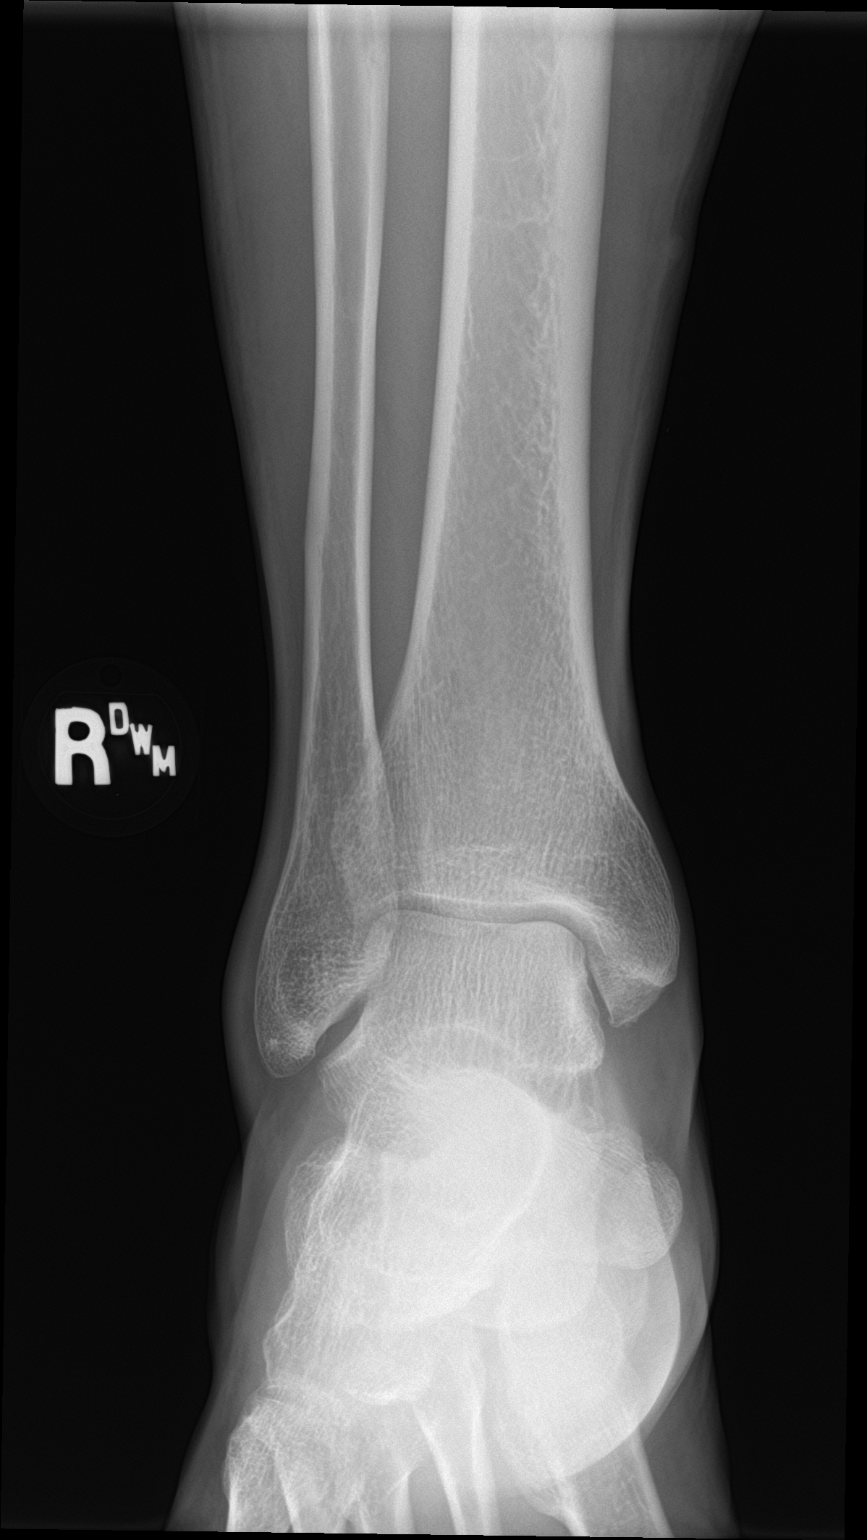

[ankle obl]
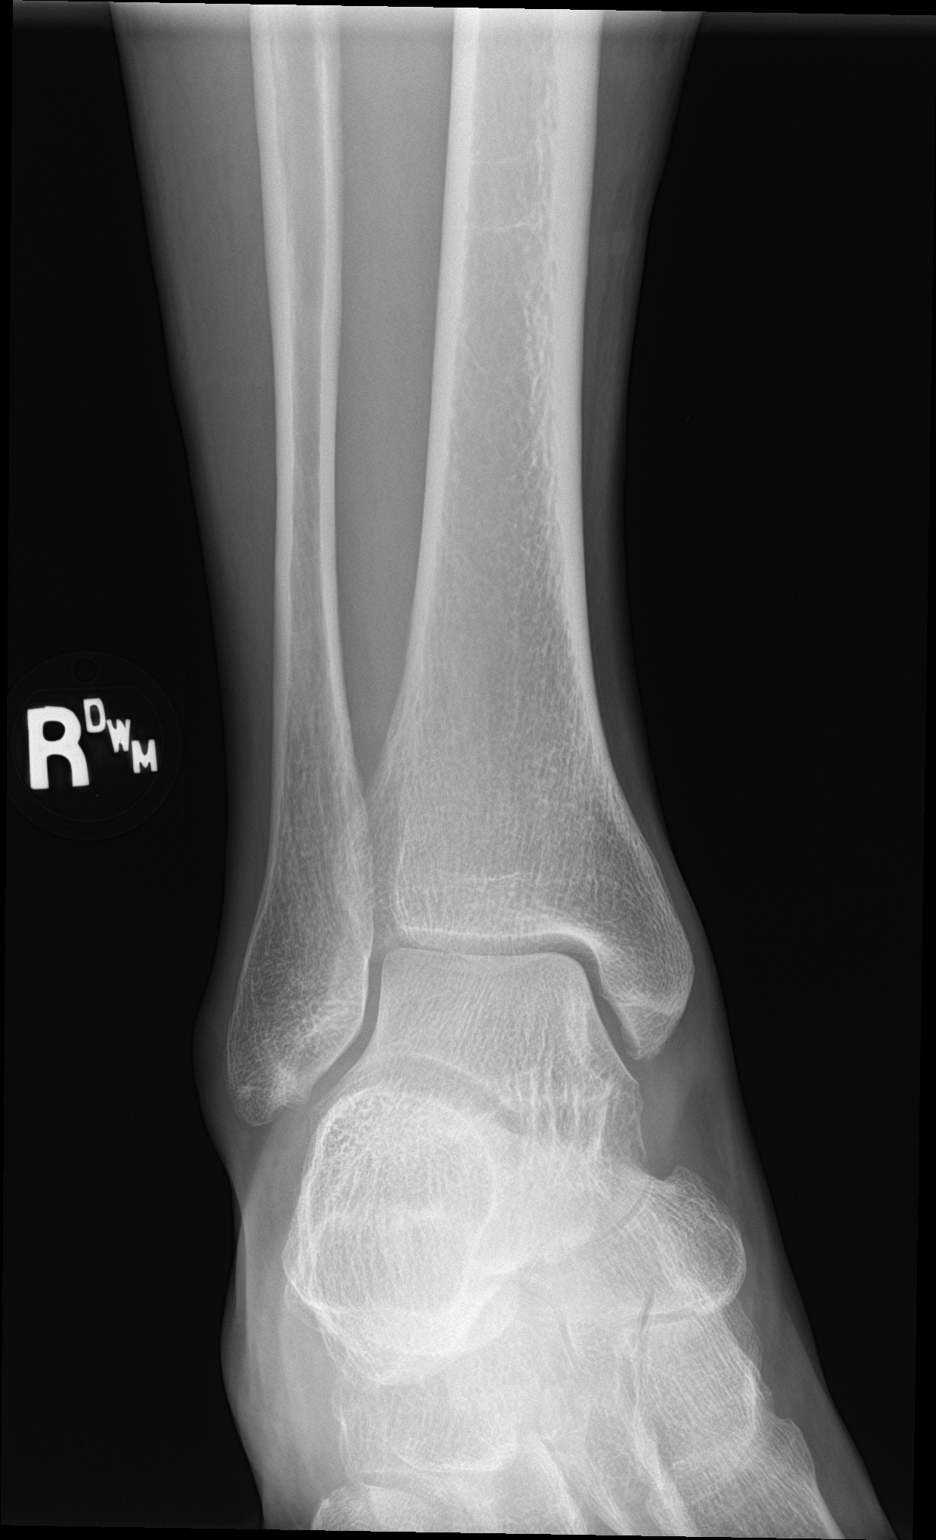

[ankle lat]
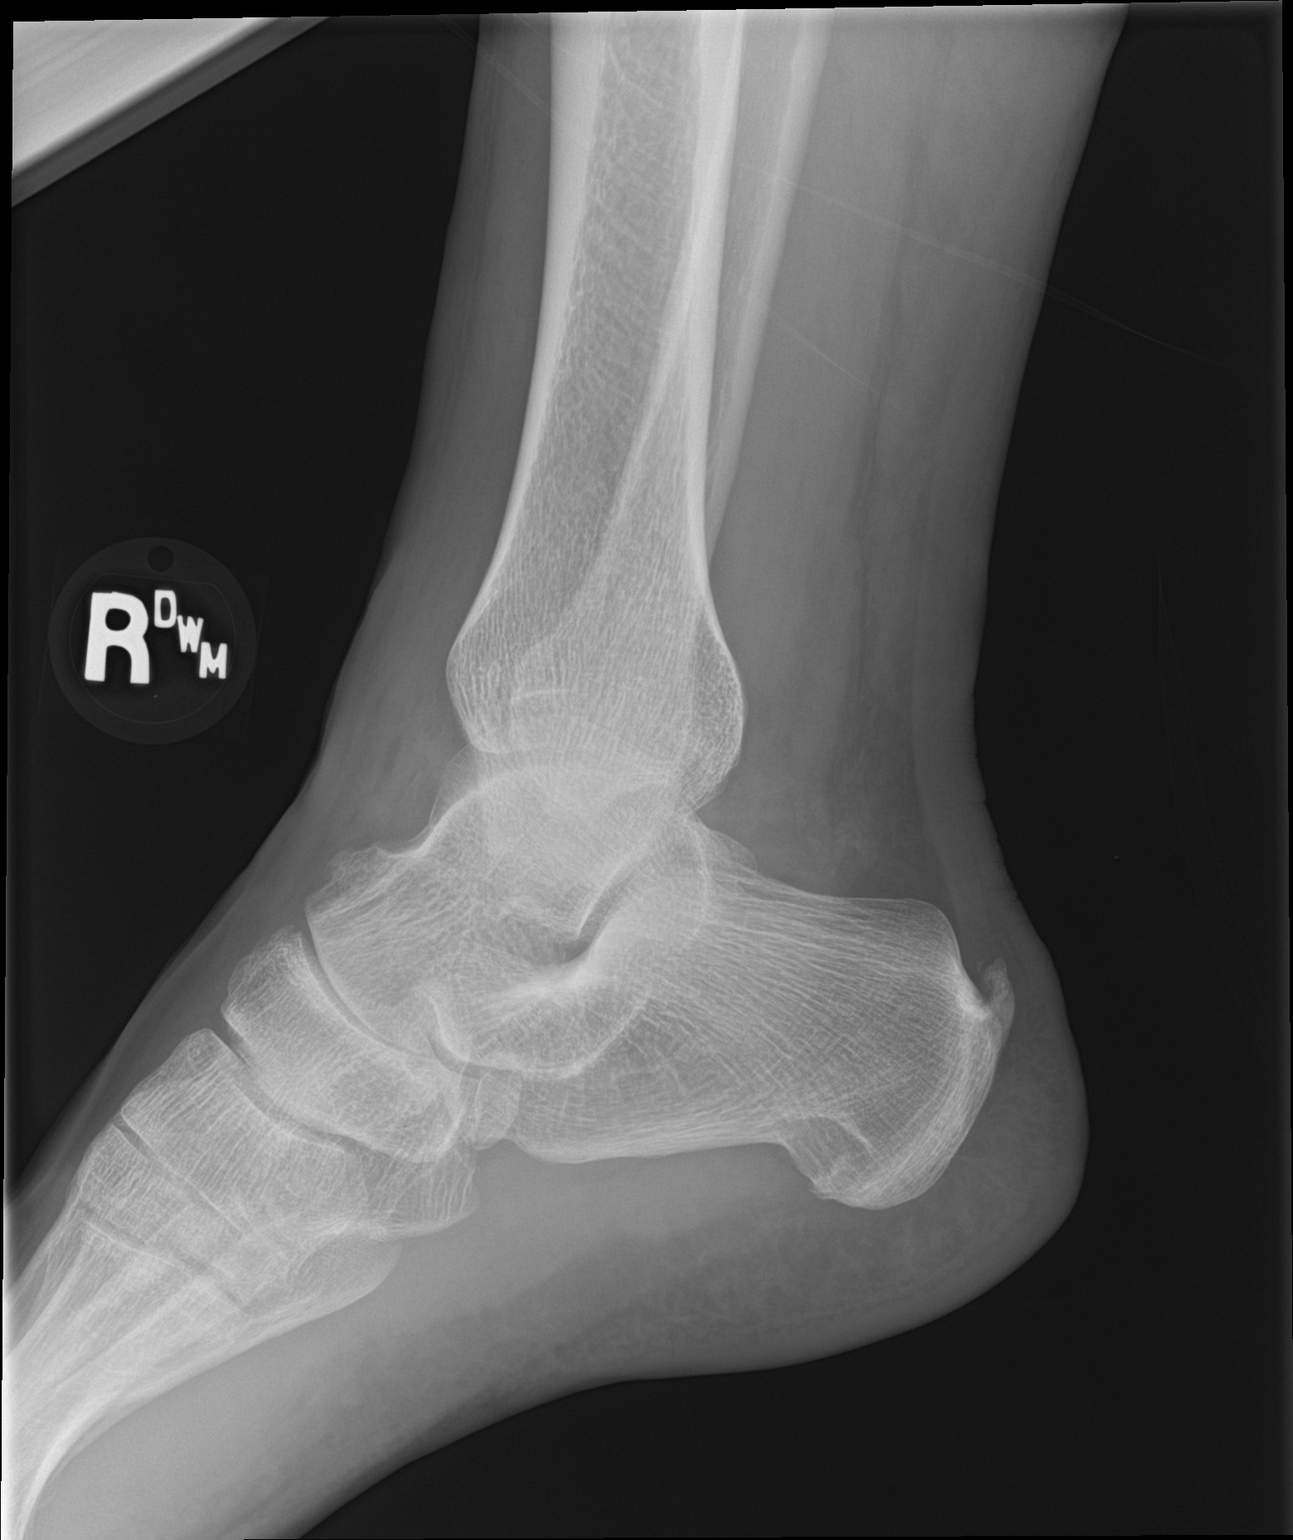

[3 of 3 positions shown; findings below may reference images not displayed]

FINDINGS: Mild diffuse soft tissue swelling. Moderately large posterior and
small inferior calcaneal spurs. No fracture, dislocation or
effusion.
IMPRESSION: Mild soft tissue swelling and calcaneal spurs.

## 2017-07-25 IMAGING — DX DG FOOT COMPLETE 3+V*R*
3 series · 3 of 3 positions shown · non-contrast
Comparison: Right ankle radiographs obtained at the same time.

CLINICAL DATA: Right ankle and foot pain and swelling for the past
3 weeks. No known injury.

EXAM:
RIGHT FOOT COMPLETE - 3+ VIEW

[foot ap]
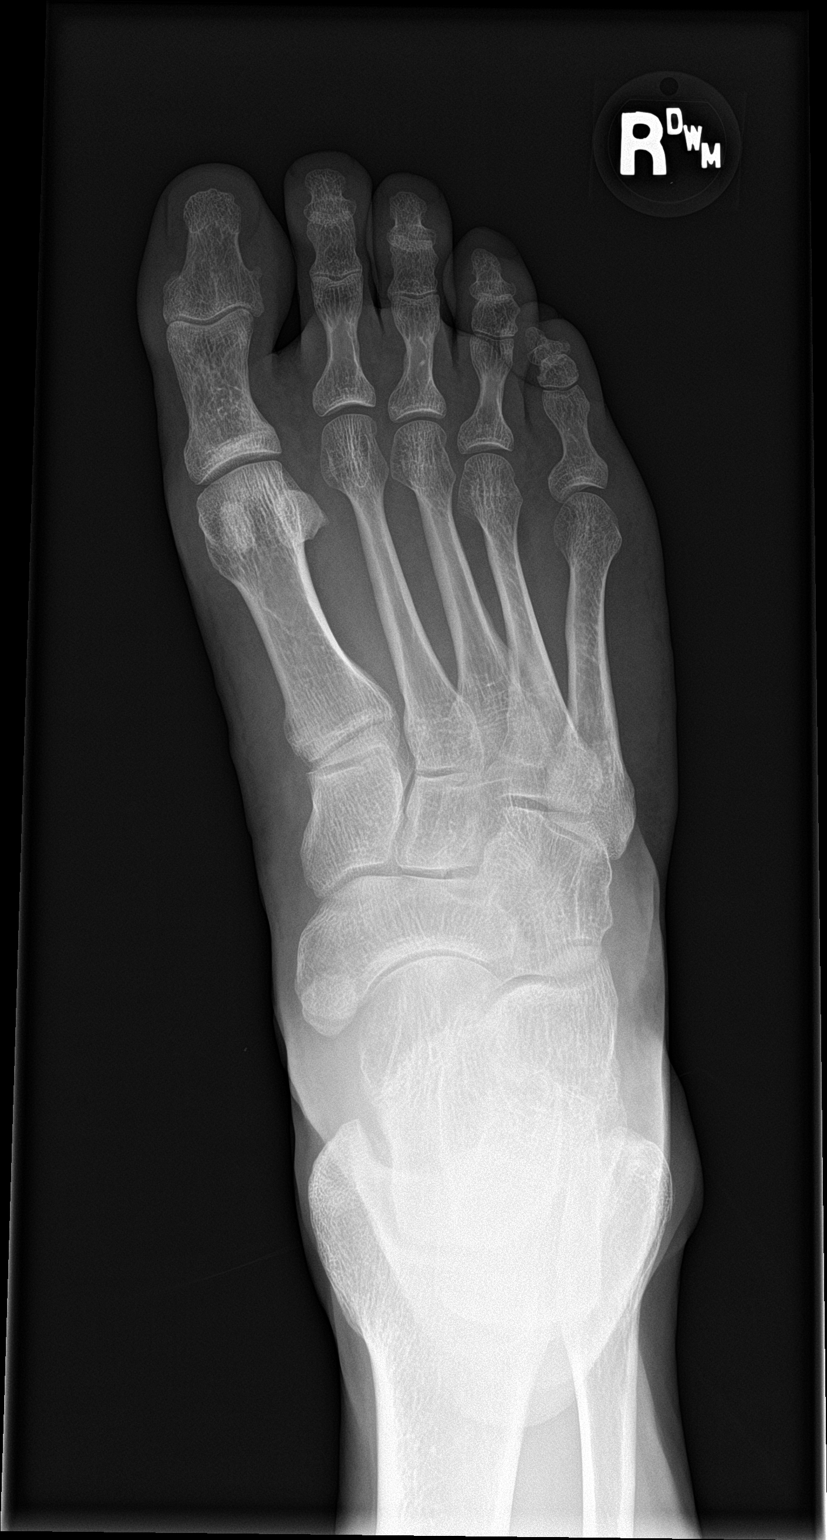

[foot obl]
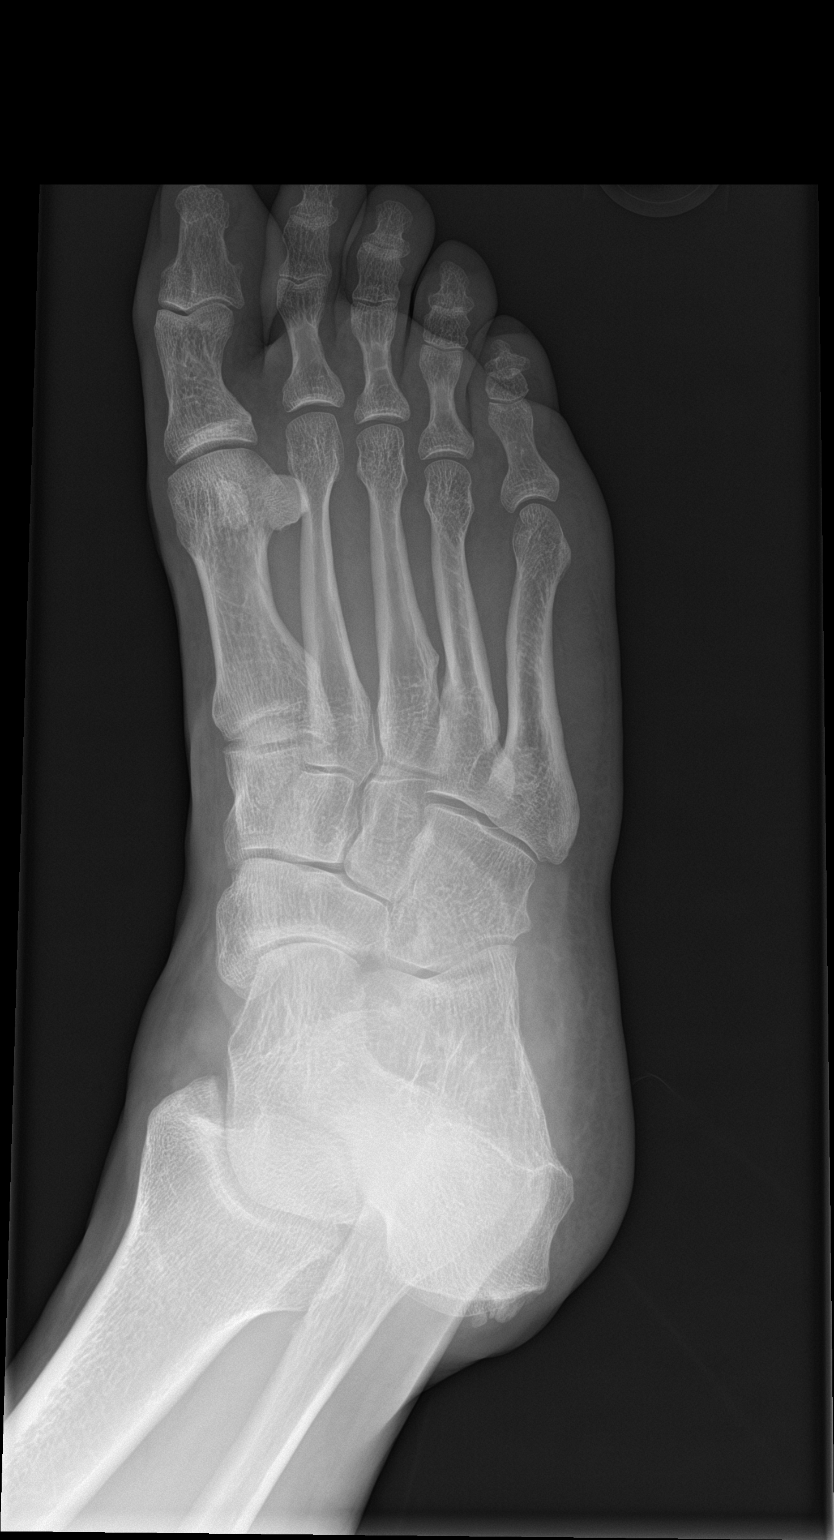

[foot lat]
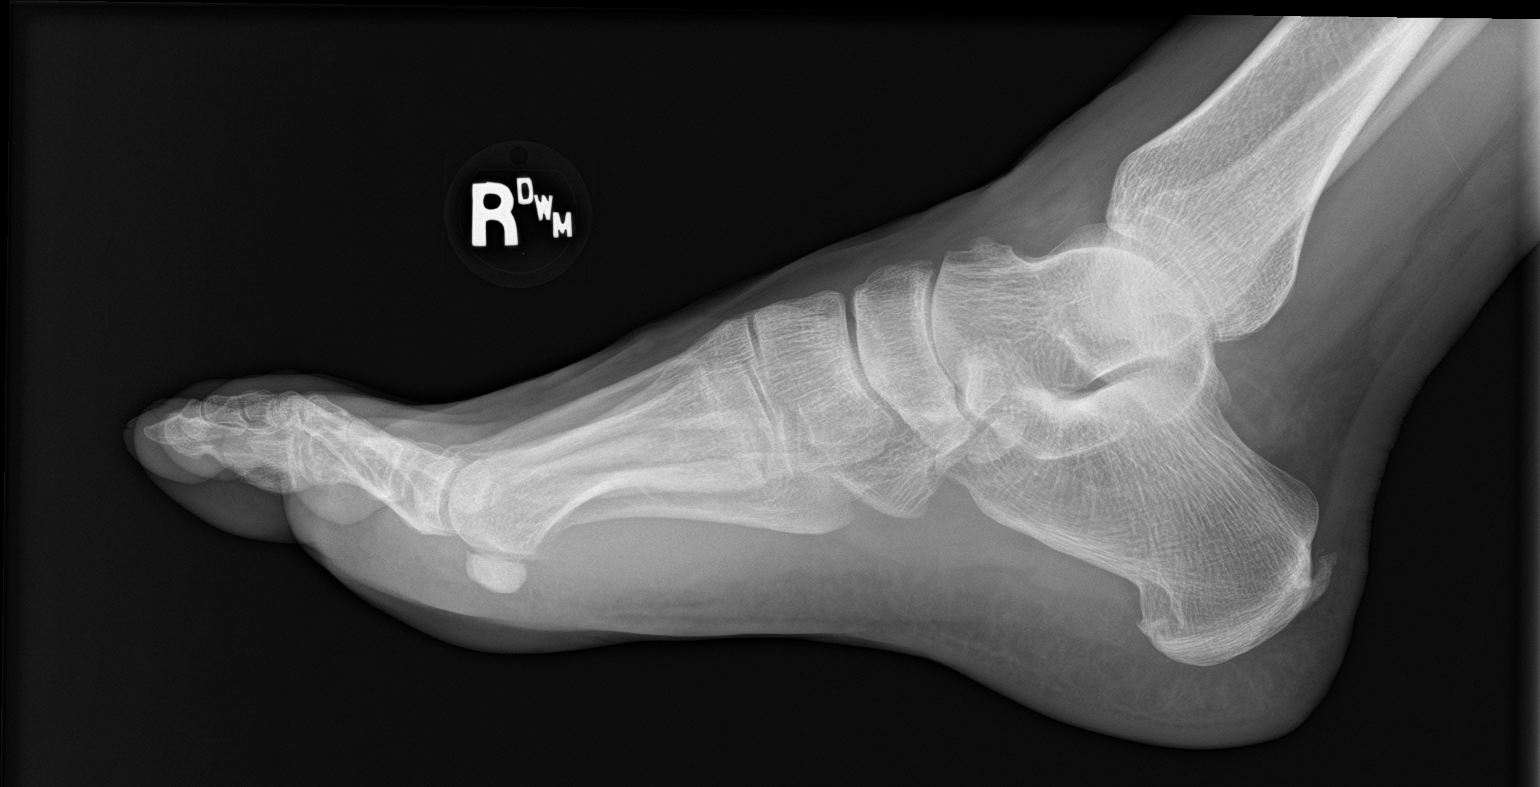

[3 of 3 positions shown; findings below may reference images not displayed]

FINDINGS: Previously described calcaneal spurs. Mild dorsal tarsal spur
formation. Otherwise, unremarkable bones and soft tissues.
IMPRESSION: No acute abnormality.  Spur formation, as described above.

## 2017-08-24 ENCOUNTER — Ambulatory Visit (INDEPENDENT_AMBULATORY_CARE_PROVIDER_SITE_OTHER): Payer: 59 | Admitting: Osteopathic Medicine

## 2017-08-24 ENCOUNTER — Encounter: Payer: Self-pay | Admitting: Osteopathic Medicine

## 2017-08-24 VITALS — BP 126/85 | HR 88 | Ht 72.0 in | Wt 217.0 lb

## 2017-08-24 DIAGNOSIS — B354 Tinea corporis: Secondary | ICD-10-CM

## 2017-08-24 DIAGNOSIS — I10 Essential (primary) hypertension: Secondary | ICD-10-CM

## 2017-08-24 MED ORDER — VALACYCLOVIR HCL 1 G PO TABS: 1000.0000 mg | ORAL_TABLET | Freq: Every day | ORAL | 3 refills | Status: DC

## 2017-08-24 MED ORDER — TERBINAFINE HCL 250 MG PO TABS: 250.0000 mg | ORAL_TABLET | Freq: Every day | ORAL | 0 refills | Status: AC

## 2017-08-24 MED ORDER — TERBINAFINE HCL 1 % EX CREA: TOPICAL_CREAM | CUTANEOUS | 1 refills | Status: DC

## 2017-08-24 NOTE — Progress Notes (Signed)
HPI: Mason Rangel is a 51 y.o. male  who presents to [name] today, 08/24/17,  for chief complaint of:  Chief Complaint  Patient presents with  . Rash     Context: seems to get similar rash every summer . Location: L posterior thigh/buttock, large patch . Quality: itching . Duration: few weeks this time . Modifying factors: steroid cream hasn't helped   Past medical history, surgical history, social history and family history reviewed.  Patient Active Problem List   Diagnosis Date Noted  . Diastolic dysfunction 04/28/2017  . LVH (left ventricular hypertrophy) due to hypertensive disease, without heart failure 03/24/2017  . Hypogonadism in male 10/19/2016  . Swelling of foot joint 07/10/2016  . Right foot pain 07/10/2016  . Left sided chest pain 04/06/2016  . Anxiety 04/06/2016  . Insomnia 04/06/2016  . Adjustment disorder with mixed anxiety and depressed mood 04/06/2016  . Essential hypertension, benign 04/06/2016  . GERD (gastroesophageal reflux disease) 04/06/2016  . Esophageal stenosis 04/06/2016    Current medication list and allergy/intolerance information reviewed.   Current Outpatient Prescriptions on File Prior to Visit  Medication Sig Dispense Refill  . amLODipine (NORVASC) 5 MG tablet Take 1 tablet (5 mg total) by mouth daily. 90 tablet 1  . chlorproMAZINE (THORAZINE) 25 MG tablet Take 1 tablet (25 mg total) by mouth 3 (three) times daily. 30 tablet 0  . clonazePAM (KLONOPIN) 0.5 MG tablet Take 1 tablet (0.5 mg total) by mouth 2 (two) times daily. 60 tablet 1  . dexlansoprazole (DEXILANT) 60 MG capsule Take 1 capsule (60 mg total) by mouth daily. 30 capsule 11  . fluticasone (FLONASE) 50 MCG/ACT nasal spray Place 2 sprays into both nostrils daily. 48 g 4  . lisinopril (PRINIVIL,ZESTRIL) 40 MG tablet Take 1 tablet (40 mg total) by mouth daily. 90 tablet 1  . metoprolol succinate (TOPROL-XL) 50 MG 24 hr tablet Take 1  tablet (50 mg total) by mouth daily. 90 tablet 1  . sertraline (ZOLOFT) 50 MG tablet Take 1 tablet (50 mg total) by mouth daily. 90 tablet 1  . testosterone cypionate (DEPOTESTOTERONE CYPIONATE) 100 MG/ML injection Inject into the muscle every 7 (seven) days. For IM use only    . traZODone (DESYREL) 50 MG tablet Take 1-2 tablets (50-100 mg total) by mouth at bedtime as needed for sleep. 90 tablet 3  . triamcinolone cream (KENALOG) 0.1 % Apply 1 application topically 2 (two) times daily. 60 g 1  . valACYclovir (VALTREX) 1000 MG tablet Take 1 tablet (1,000 mg total) by mouth daily. 90 tablet 0  . zolpidem (AMBIEN) 10 MG tablet Take 1 tablet (10 mg total) by mouth at bedtime as needed for sleep. 90 tablet 3   No current facility-administered medications on file prior to visit.    Allergies  Allergen Reactions  . Lorazepam Other (See Comments)    Severe nightmares  . Morphine And Related       Review of Systems:  Constitutional: No recent illness, no fever/chills  HEENT: No  headache, no vision change  Cardiac: No  chest pain  Musculoskeletal: No new myalgia/arthralgia  Skin: +Rash  Hem/Onc: No  easy bruising/bleeding    Exam:  BP 126/85   Pulse 88   Ht 6' (1.829 m)   Wt 217 lb (98.4 kg)   BMI 29.43 kg/m   Constitutional: VS see above. General Appearance: alert, well-developed, well-nourished, NAD  Respiratory: Normal respiratory effort  Musculoskeletal: Gait normal. Symmetric and  independent movement of all extremities  Neurological: Normal balance/coordination. No tremor.  Skin: warm, dry, intact. (+)lasrge maculopapular patch w/ fairly distinct border palm-sized on L posterior thigh/buttock area c/w tinea  Psychiatric: Normal judgment/insight. Normal mood and affect. Oriented x3.    ASSESSMENT/PLAN:   Pretty severe tinea, will treat with systemic plus lamisil, CMP and CBC few months ago ok.   Expect rash to improve and resolve within 2-4 weeks - if no  improvement or if persisting, or if worse/change, RTC for biopsy  Tinea corporis - Plan: terbinafine (LAMISIL) 250 MG tablet, terbinafine (LAMISIL) 1 % cream  Essential hypertension, benign      Follow-up plan: Return if symptoms worsen or fail to improve.  Visit summary with medication list and pertinent instructions was printed for patient to review, alert [place] if any changes needed. All questions at time of visit were answered - patient instructed to contact office with any additional concerns. ER/RTC precautions were reviewed with the patient and understanding verbalized.

## 2017-10-22 ENCOUNTER — Encounter: Payer: 59 | Admitting: Physician Assistant

## 2017-11-12 ENCOUNTER — Encounter: Payer: 59 | Admitting: Physician Assistant

## 2017-11-22 ENCOUNTER — Ambulatory Visit (INDEPENDENT_AMBULATORY_CARE_PROVIDER_SITE_OTHER): Payer: 59 | Admitting: Physician Assistant

## 2017-11-22 ENCOUNTER — Encounter: Payer: Self-pay | Admitting: Physician Assistant

## 2017-11-22 VITALS — BP 154/88 | Ht 72.0 in | Wt 218.0 lb

## 2017-11-22 DIAGNOSIS — Z23 Encounter for immunization: Secondary | ICD-10-CM

## 2017-11-22 DIAGNOSIS — Z131 Encounter for screening for diabetes mellitus: Secondary | ICD-10-CM | POA: Diagnosis not present

## 2017-11-22 DIAGNOSIS — Z13220 Encounter for screening for lipoid disorders: Secondary | ICD-10-CM | POA: Diagnosis not present

## 2017-11-22 DIAGNOSIS — Z0000 Encounter for general adult medical examination without abnormal findings: Secondary | ICD-10-CM

## 2017-11-22 DIAGNOSIS — F5101 Primary insomnia: Secondary | ICD-10-CM | POA: Diagnosis not present

## 2017-11-22 DIAGNOSIS — I10 Essential (primary) hypertension: Secondary | ICD-10-CM | POA: Insufficient documentation

## 2017-11-22 MED ORDER — TRAZODONE HCL 50 MG PO TABS: 50.0000 mg | ORAL_TABLET | Freq: Every evening | ORAL | 3 refills | Status: DC | PRN

## 2017-11-22 MED ORDER — OLMESARTAN MEDOXOMIL-HCTZ 40-12.5 MG PO TABS: 1.0000 | ORAL_TABLET | Freq: Every day | ORAL | 0 refills | Status: DC

## 2017-11-22 NOTE — Progress Notes (Addendum)
Subjective:    Patient ID: Mason Rangel, male    DOB: [birth year], 51 y.o.   MRN: [id number]  HPI Patient is a 51 y/o male who presents for annual physical exam. He has no complaints today. He is [place] in [place] with his mom.  HTN - Patients BP is elevated today initially at 161/110 mmHg and decreased to 154/88 mmHg 30 minutes later. He takes Norvasc and Lisinopril and reports taking it this morning. He does admit that for a few days last week he did not take it. He has not been checking his BP at home but denies chest pains or palpitations. He does report swelling in his right ankle. He feels like resolves when he does not take norvasc.   Insomnia - Patient is doing well on Trazadone and Ambien. Denies medication side effects.  Esophageal stenosis & GERD - Patient reports ED visit for uncontrolled hiccups for 24 hours on 07/20/17. At this time he underwent an endoscopy with dilation of his esophagus. He reports that the endoscopy also showed that his esophagus was "raw" from him taking daily ibuprofen as prophylaxis before going to the gym. He was advised to stop taking ibuprofen and was able to decrease his Dexilant from 60 mg to 30 mg.  Hypogonadism - Patient is taking testosterone weekly and reports doing well. He is being followed by urology for this.  Anxiety - Patient reports that he is doing much better and only takes half a Klonipin once every 2 weeks if needed for symptoms.  .. Active Ambulatory Problems    Diagnosis Date Noted  . Left sided chest pain 04/06/2016  . Anxiety 04/06/2016  . Insomnia 04/06/2016  . Adjustment disorder with mixed anxiety and depressed mood 04/06/2016  . Essential hypertension, benign 04/06/2016  . GERD (gastroesophageal reflux disease) 04/06/2016  . Esophageal stenosis 04/06/2016  . Swelling of foot joint 07/10/2016  . Right foot pain 07/10/2016  . Hypogonadism in male 10/19/2016  . LVH (left ventricular hypertrophy) due to hypertensive disease,  without heart failure 03/24/2017  . Diastolic dysfunction 04/28/2017   Resolved Ambulatory Problems    Diagnosis Date Noted  . No Resolved Ambulatory Problems   Past Medical History:  Diagnosis Date  . Anxiety   . Gout   . Hypertension    .[name] Kitchen. Family History  Problem Relation Age of Onset  . Hypertension Mother   . ALS Father   . Stroke Maternal Grandfather   . Heart attack Paternal Grandmother   . Heart attack Paternal Grandfather    .[name] Kitchen. Social History   Socioeconomic History  . Marital status: Divorced    Spouse name: Not on file  . Number of children: Not on file  . Years of education: Not on file  . Highest education level: Not on file  Social Needs  . Financial resource strain: Not on file  . Food insecurity - worry: Not on file  . Food insecurity - inability: Not on file  . Transportation needs - medical: Not on file  . Transportation needs - non-medical: Not on file  Occupational History  . Not on file  Tobacco Use  . Smoking status: Never Smoker  . Smokeless tobacco: Never Used  Substance and Sexual Activity  . Alcohol use: Yes    Comment: occ  . Drug use: No  . Sexual activity: Not on file  Other Topics Concern  . Not on file  Social History Narrative  . Not on file  Review of Systems See HPI, all other review of systems are negative    Objective:   Physical Exam  Constitutional: He is oriented to person, place, and time. He appears well-developed and well-nourished.  HENT:  Head: Normocephalic and atraumatic.  Right Ear: External ear normal.  Left Ear: External ear normal.  Nose: Nose normal.  Mouth/Throat: Oropharynx is clear and moist.  Eyes: Conjunctivae and EOM are normal. Pupils are equal, round, and reactive to light.  Neck: Normal range of motion. Neck supple. No thyromegaly present.  Cardiovascular: Normal rate, regular rhythm and normal heart sounds.  Pulmonary/Chest: Effort normal and breath sounds normal.  Lymphadenopathy:     He has no cervical adenopathy.  Neurological: He is alert and oriented to person, place, and time. He has normal reflexes.  Skin: Skin is warm and dry.  Psychiatric: He has a normal mood and affect. His behavior is normal. Thought content normal.  Vitals reviewed.     Assessment & Plan:  [name] Kitchen.[name] Kitchen.Sharma Covertorman was seen today for annual exam.  Diagnoses and all orders for this visit:  Routine physical examination -     Lipid Panel w/reflex Direct LDL -     COMPLETE METABOLIC PANEL WITH GFR -     CBC  Primary insomnia -     traZODone (DESYREL) 50 MG tablet; Take 1-2 tablets (50-100 mg total) by mouth at bedtime as needed for sleep.  Hypertension, uncontrolled  Screening for lipid disorders -     Lipid Panel w/reflex Direct LDL  Screening for diabetes mellitus -     COMPLETE METABOLIC PANEL WITH GFR  Influenza vaccine needed -     Flu Vaccine QUAD 6+ mos PF IM (Fluarix Quad PF)  Other orders -     olmesartan-hydrochlorothiazide (BENICAR HCT) 40-12.5 MG tablet; Take 1 tablet by mouth daily.   HTN - Patients BP is elevated today initially at 161/110 mmHg and decreased to 154/88 mmHg 30 minutes later. He also reports right ankle edema, suspect medication side effect. Stop norvasc and lisionpril. Will switch to Benicar HCTZ daily to control BP. Advised patient to monitor BP at home. Follow-up in 2 weeks for BP recheck.  Insomnia - Doing well with Ambien and Trazodone at bedtime. Continue as prescribed.  Routine screening - Vaccinations up to date, flu vaccine administered today. Fasting lipids, CMP, and CBC ordered for routing screening of lipid, DM, kidney and liver. Advised patient to try Tumeric 500 mg twice daily for joint pain since ibuprofen was causing worsening GERD symptoms.

## 2017-11-22 NOTE — Patient Instructions (Signed)

## 2017-11-23 NOTE — Progress Notes (Signed)
Call pt: TG MUCH better. HDL ok but could better. LDL looks great.  Kidney, liver, glucose looks great.  Fasting glucose just a tad elevated. Can we add a1c just to see how trending?

## 2017-11-24 LAB — COMPLETE METABOLIC PANEL WITH GFR
AG Ratio: 1.4 (calc) (ref 1.0–2.5)
ALT: 13 U/L (ref 9–46)
AST: 14 U/L (ref 10–35)
Albumin: 3.8 g/dL (ref 3.6–5.1)
Alkaline phosphatase (APISO): 58 U/L (ref 40–115)
BUN: 12 mg/dL (ref 7–25)
CO2: 30 mmol/L (ref 20–32)
Calcium: 9.2 mg/dL (ref 8.6–10.3)
Chloride: 103 mmol/L (ref 98–110)
Creat: 1.16 mg/dL (ref 0.70–1.33)
GFR, Est African American: 83 mL/min/{1.73_m2} (ref >=60)
GFR, Est Non African American: 72 mL/min/{1.73_m2} (ref >=60)
Globulin: 2.8 g/dL (calc) (ref 1.9–3.7)
Glucose, Bld: 101 mg/dL — ABNORMAL HIGH (ref 65–99)
Potassium: 4.3 mmol/L (ref 3.5–5.3)
Sodium: 138 mmol/L (ref 135–146)
Total Bilirubin: 0.4 mg/dL (ref 0.2–1.2)
Total Protein: 6.6 g/dL (ref 6.1–8.1)

## 2017-11-24 LAB — LIPID PANEL W/REFLEX DIRECT LDL
Cholesterol: 163 mg/dL (ref <200)
HDL: 35 mg/dL — ABNORMAL LOW (ref >40)
LDL Cholesterol (Calc): 109 mg/dL (calc) — ABNORMAL HIGH
Non-HDL Cholesterol (Calc): 128 mg/dL (calc) (ref <130)
Total CHOL/HDL Ratio: 4.7 (calc) (ref <5.0)
Triglycerides: 99 mg/dL (ref <150)

## 2017-11-24 LAB — CBC
HCT: 40.4 % (ref 38.5–50.0)
Hemoglobin: 13.8 g/dL (ref 13.2–17.1)
MCH: 30.9 pg (ref 27.0–33.0)
MCHC: 34.2 g/dL (ref 32.0–36.0)
MCV: 90.4 fL (ref 80.0–100.0)
MPV: 10.5 fL (ref 7.5–12.5)
Platelets: 183 10*3/uL (ref 140–400)
RBC: 4.47 10*6/uL (ref 4.20–5.80)
RDW: 14.3 % (ref 11.0–15.0)
WBC: 6.4 10*3/uL (ref 3.8–10.8)

## 2017-11-24 LAB — HEMOGLOBIN A1C W/OUT EAG: Hgb A1c MFr Bld: 5.2 % of total Hgb (ref <5.7)

## 2017-12-04 ENCOUNTER — Other Ambulatory Visit: Payer: Self-pay | Admitting: Family Medicine

## 2017-12-04 DIAGNOSIS — F5101 Primary insomnia: Secondary | ICD-10-CM

## 2017-12-06 ENCOUNTER — Other Ambulatory Visit: Payer: Self-pay | Admitting: *Deleted

## 2017-12-06 DIAGNOSIS — F5101 Primary insomnia: Secondary | ICD-10-CM

## 2017-12-06 MED ORDER — ZOLPIDEM TARTRATE 10 MG PO TABS: 10.0000 mg | ORAL_TABLET | Freq: Every evening | ORAL | 0 refills | Status: DC | PRN

## 2017-12-27 ENCOUNTER — Ambulatory Visit (INDEPENDENT_AMBULATORY_CARE_PROVIDER_SITE_OTHER): Payer: 59

## 2017-12-27 ENCOUNTER — Ambulatory Visit (INDEPENDENT_AMBULATORY_CARE_PROVIDER_SITE_OTHER): Payer: 59 | Admitting: Podiatry

## 2017-12-27 ENCOUNTER — Other Ambulatory Visit: Payer: Self-pay | Admitting: Podiatry

## 2017-12-27 DIAGNOSIS — M722 Plantar fascial fibromatosis: Secondary | ICD-10-CM | POA: Diagnosis not present

## 2017-12-27 DIAGNOSIS — M79671 Pain in right foot: Secondary | ICD-10-CM

## 2017-12-27 NOTE — Progress Notes (Signed)
Subjective:    Patient ID: Mason Rangel, male    DOB: [birth year], 52 y.o.   MRN: [id number]  HPI Male presents the office today for concerns of right heel pain which is been ongoing about 5 months.  He states the bottom comfort fit socks which helps some but he continues to get pain.  Pain is intermittent.  He has pain in the morning he first gets up after running.  No recent injury or trauma.  No swelling or redness.  No numbness or tingling.  The pain is not waking up at night.  He has no other concerns today.   Review of Systems  All other systems reviewed and are negative.  Past Medical History:  Diagnosis Date  . Anxiety   . Gout   . Hypertension     Past Surgical History:  Procedure Laterality Date  . ESOPHAGEAL DILATION    . upper endoscopy with dilation  2009     Current Outpatient Medications:  .  clonazePAM (KLONOPIN) 0.5 MG tablet, Take 1 tablet (0.5 mg total) by mouth 2 (two) times daily., Disp: 60 tablet, Rfl: 1 .  dexlansoprazole (DEXILANT) 60 MG capsule, Take 1 capsule (60 mg total) by mouth daily. (Patient taking differently: Take 30 mg by mouth daily. ), Disp: 30 capsule, Rfl: 11 .  fluticasone (FLONASE) 50 MCG/ACT nasal spray, Place 2 sprays into both nostrils daily., Disp: 48 g, Rfl: 4 .  lisinopril (PRINIVIL,ZESTRIL) 40 MG tablet, Take 1 tablet (40 mg total) by mouth daily., Disp: 90 tablet, Rfl: 1 .  olmesartan-hydrochlorothiazide (BENICAR HCT) 40-12.5 MG tablet, Take 1 tablet by mouth daily., Disp: 90 tablet, Rfl: 0 .  testosterone cypionate (DEPOTESTOTERONE CYPIONATE) 100 MG/ML injection, Inject into the muscle every 7 (seven) days. For IM use only, Disp: , Rfl:  .  valACYclovir (VALTREX) 1000 MG tablet, Take 1 tablet (1,000 mg total) by mouth daily., Disp: 90 tablet, Rfl: 3 .  amLODipine (NORVASC) 5 MG tablet, Take 1 tablet (5 mg total) by mouth daily. (Patient not taking: Reported on 12/27/2017), Disp: 90 tablet, Rfl: 1 .  traZODone (DESYREL) 50 MG  tablet, Take 1-2 tablets (50-100 mg total) by mouth at bedtime as needed for sleep. (Patient not taking: Reported on 12/27/2017), Disp: 90 tablet, Rfl: 3 .  zolpidem (AMBIEN) 10 MG tablet, Take 1 tablet (10 mg total) by mouth at bedtime as needed. for sleep (Patient not taking: Reported on 12/27/2017), Disp: 90 tablet, Rfl: 0  Allergies  Allergen Reactions  . Lorazepam Other (See Comments)    Severe nightmares  . Morphine And Related     Social History   Socioeconomic History  . Marital status: Divorced    Spouse name: Not on file  . Number of children: Not on file  . Years of education: Not on file  . Highest education level: Not on file  Social Needs  . Financial resource strain: Not on file  . Food insecurity - worry: Not on file  . Food insecurity - inability: Not on file  . Transportation needs - medical: Not on file  . Transportation needs - non-medical: Not on file  Occupational History  . Not on file  Tobacco Use  . Smoking status: Never Smoker  . Smokeless tobacco: Never Used  Substance and Sexual Activity  . Alcohol use: Yes    Comment: occ  . Drug use: No  . Sexual activity: Not on file  Other Topics Concern  . Not on file  Social History Narrative  . Not on file        Objective:   Physical Exam  General: AAO x3, NAD  Dermatological: Skin is warm, dry and supple bilateral. Nails x 10 are well manicured; remaining integument appears unremarkable at this time. There are no open sores, no preulcerative lesions, no rash or signs of infection present.  Vascular: Dorsalis Pedis artery and Posterior Tibial artery pedal pulses are 2/4 bilateral with immedate capillary fill time. There is no pain with calf compression, swelling, warmth, erythema.   Neruologic: Grossly intact via light touch bilateral.  Protective threshold with Semmes Wienstein monofilament intact to all pedal sites bilateral.  Negative Tinel sign.  Musculoskeletal: Tenderness to palpation along  the plantar medial tubercle of the calcaneus at the insertion of plantar fascia on the right foot. There is no pain along the course of the plantar fascia within the arch of the foot. Plantar fascia appears to be intact. There is no pain with lateral compression of the calcaneus or pain with vibratory sensation. There is no pain along the course or insertion of the achilles tendon. No other areas of tenderness to bilateral lower extremities. Muscular strength 5/5 in all groups tested bilateral.    Assessment & Plan:  52 year old male right heel pain, plantar fasciitis -Treatment options discussed including all alternatives, risks, and complications -Etiology of symptoms were discussed -X-rays were obtained and reviewed with the patient.  No evidence of acute fracture.  Minimal inferior calcaneal spurring is present.  Posterior calcaneal spur present. -Discussed a steroid injection he wishes to proceed.  See procedure note below. -Hold off on anti-inflammatories at this point given his medical history. -Plantar fascial brace dispensed -Stretching, icing exercises daily -Discussed shoe modifications and orthotics -Follow-up in 3 weeks or sooner if needed.  Call any questions or concerns.  Has no further questions today and agrees this plan.  Procedure: Injection Tendon/Ligament Discussed alternatives, risks, complications and verbal consent was obtained.  Location: Right plantar fascia at the glabrous junction; medial approach. Skin Prep: Alcohol. Injectate: 1 cc 0.5% marcaine plain, 1 cc 0.5% Marcaine plain and, 1 cc kenalog 10. Disposition: Patient tolerated procedure well. Injection site dressed with a band-aid.  Post-injection care was discussed and return precautions discussed.    [name]

## 2017-12-27 NOTE — Patient Instructions (Signed)

## 2017-12-28 DIAGNOSIS — M722 Plantar fascial fibromatosis: Secondary | ICD-10-CM | POA: Insufficient documentation

## 2018-01-17 ENCOUNTER — Ambulatory Visit: Payer: 59 | Admitting: Podiatry

## 2018-01-28 ENCOUNTER — Encounter: Payer: Self-pay | Admitting: Physician Assistant

## 2018-01-28 ENCOUNTER — Ambulatory Visit (INDEPENDENT_AMBULATORY_CARE_PROVIDER_SITE_OTHER): Payer: 59 | Admitting: Physician Assistant

## 2018-01-28 VITALS — BP 137/80 | HR 107 | Temp 98.6°F | Ht 72.0 in

## 2018-01-28 DIAGNOSIS — R509 Fever, unspecified: Secondary | ICD-10-CM | POA: Diagnosis not present

## 2018-01-28 DIAGNOSIS — R5381 Other malaise: Secondary | ICD-10-CM | POA: Diagnosis not present

## 2018-01-28 DIAGNOSIS — R590 Localized enlarged lymph nodes: Secondary | ICD-10-CM

## 2018-01-28 LAB — CBC WITH DIFFERENTIAL/PLATELET
Basophils Absolute: 18 cells/uL (ref 0–200)
Basophils Relative: 0.4 %
Eosinophils Absolute: 9 cells/uL — ABNORMAL LOW (ref 15–500)
Eosinophils Relative: 0.2 %
HCT: 41.1 % (ref 38.5–50.0)
Hemoglobin: 14.7 g/dL (ref 13.2–17.1)
Lymphs Abs: 911 cells/uL (ref 850–3900)
MCH: 31.5 pg (ref 27.0–33.0)
MCHC: 35.8 g/dL (ref 32.0–36.0)
MCV: 88 fL (ref 80.0–100.0)
MPV: 9.7 fL (ref 7.5–12.5)
Monocytes Relative: 20.2 %
Neutro Abs: 2732 cells/uL (ref 1500–7800)
Neutrophils Relative %: 59.4 %
Platelets: 160 10*3/uL (ref 140–400)
RBC: 4.67 10*6/uL (ref 4.20–5.80)
RDW: 14 % (ref 11.0–15.0)
Total Lymphocyte: 19.8 %
WBC mixed population: 929 cells/uL (ref 200–950)
WBC: 4.6 10*3/uL (ref 3.8–10.8)

## 2018-01-28 LAB — POCT RAPID STREP A (OFFICE): Rapid Strep A Screen: NEGATIVE

## 2018-01-28 LAB — MONONUCLEOSIS SCREEN: Heterophile, Mono Screen: NEGATIVE

## 2018-01-28 NOTE — Progress Notes (Signed)
Mono is negative. Treat virally for now and let me know with any symptoms changes.

## 2018-01-28 NOTE — Progress Notes (Signed)
   Subjective:    Patient ID: Mason Rangel, male    DOB: [birth year], 52 y.o.   MRN: [id number]  HPI Patient is a [age] year old male with history of hypertension who presents to the clinic with bilateral tender masses in the neck.  He noticed the first swollen mass in the left side of the neck.  This morning it went into the right side of his neck.  Patient denies any cough, sinus pressure, ear pain.  He did have a low-grade fever last night.  He does have a scratchy throat today. He is just tired and "worn out". He admits to traveling a lot lately. He has been on 5 flights in last 2 weeks. No cough, SoB, wheezing.   .. Active Ambulatory Problems    Diagnosis Date Noted  . Left sided chest pain 04/06/2016  . Anxiety 04/06/2016  . Insomnia 04/06/2016  . Adjustment disorder with mixed anxiety and depressed mood 04/06/2016  . Essential hypertension, benign 04/06/2016  . GERD (gastroesophageal reflux disease) 04/06/2016  . Esophageal stenosis 04/06/2016  . Swelling of foot joint 07/10/2016  . Right foot pain 07/10/2016  . Hypogonadism in male 10/19/2016  . LVH (left ventricular hypertrophy) due to hypertensive disease, without heart failure 03/24/2017  . Diastolic dysfunction 04/28/2017  . Hypertension, uncontrolled 11/22/2017  . Plantar fasciitis 12/28/2017   Resolved Ambulatory Problems    Diagnosis Date Noted  . No Resolved Ambulatory Problems   Past Medical History:  Diagnosis Date  . Anxiety   . Gout   . Hypertension       Review of Systems  All other systems reviewed and are negative.      Objective:   Physical Exam  Constitutional: He is oriented to person, place, and time. He appears well-developed and well-nourished.  HENT:  Head: Normocephalic and atraumatic.  Eyes: Conjunctivae are normal.  Neck: Normal range of motion. Neck supple.    Cardiovascular: Regular rhythm and normal heart sounds.  Tachycardia at 107.   Pulmonary/Chest: Effort normal and breath  sounds normal. He has no wheezes.  Lymphadenopathy:    He has cervical adenopathy.  Neurological: He is alert and oriented to person, place, and time.  Psychiatric: He has a normal mood and affect. His behavior is normal.          Assessment & Plan:  [name] Kitchen.[name] Kitchen.Diagnoses and all orders for this visit:  Anterior cervical adenopathy -     Monospot -     CBC with Differential -     POCT rapid strep A  Malaise -     Monospot -     CBC with Differential -     POCT rapid strep A  Fever, unspecified fever cause -     Monospot -     CBC with Differential -     POCT rapid strep A   Rapid strep was negative.  Monospot was negative.  Stat CBC was reassuring with normal WBC count.  Suspect lymph nodes are enlarged due to viral or allergic illness.  Encourage patient to start treating symptomatically.  Strongly urged him to rest and hydrate.  No other signs of infection seen on exam today.  Encouraged cool compresses and ibuprofen for the next 2-3 days regularly.  Call with any new symptoms.

## 2018-01-28 NOTE — Progress Notes (Signed)
Call pt: reassuring CBC. White count is normal.

## 2018-02-12 ENCOUNTER — Other Ambulatory Visit: Payer: Self-pay | Admitting: Physician Assistant

## 2018-02-12 MED ORDER — OLMESARTAN MEDOXOMIL-HCTZ 40-12.5 MG PO TABS: 1.0000 | ORAL_TABLET | Freq: Every day | ORAL | 2 refills | Status: DC

## 2018-02-14 ENCOUNTER — Ambulatory Visit: Payer: 59 | Admitting: Podiatry

## 2018-02-25 ENCOUNTER — Ambulatory Visit: Payer: 59 | Admitting: Podiatry

## 2018-03-10 ENCOUNTER — Ambulatory Visit (INDEPENDENT_AMBULATORY_CARE_PROVIDER_SITE_OTHER): Payer: 59 | Admitting: Podiatry

## 2018-03-10 DIAGNOSIS — M722 Plantar fascial fibromatosis: Secondary | ICD-10-CM

## 2018-03-10 NOTE — Progress Notes (Signed)
Subjective: Mason Rangel presents to the office today for follow-up evaluation of right L3, plantar fasciitis.  He states that the steroid injection did help he has been doing stretching icing exercises as much as he can.  He states the plantar fascial strap was not helping as the Velcro was not sustained.  No recent injury or trauma.  He states pain is better but does continue.  He has no other concerns today. Denies any systemic complaints such as fevers, chills, nausea, vomiting. No acute changes since last appointment, and no other complaints at this time.   Objective: AAO x3, NAD DP/PT pulses palpable bilaterally, CRT less than 3 seconds There is Ochs mild tenderness palpation of the plantar medial tubercle of the calcaneus at the insertion of the plantar fascia on the right foot.  No discomfort on the medial band within the arch of the foot.  Plantar fascia appears to be intact.  Achilles tendon appears to be intact.  No overlying edema, erythema, increased warmth.  Negative Tinel sign.  No other areas of tenderness. No open lesions or pre-ulcerative lesions.  No pain with calf compression, swelling, warmth, erythema  Assessment: Right foot plantar fasciitis  Plan: -All treatment options discussed with the patient including all alternatives, risks, complications.  -Second steroid injection was performed.  See procedure note below.  Continue stretching, ice and exercises daily.  Also discussed his renal issues as well as orthotics. -Patient encouraged to call the office with any questions, concerns, change in symptoms.   Procedure: Injection Tendon/Ligament Discussed alternatives, risks, complications and verbal consent was obtained.  Location: Right plantar fascia at the glabrous junction; medial approach. Skin Prep: Alcohol. Injectate: 0.5 cc 0.5% marcaine plain, 0.5 cc 2% lidocaine plain and, 1 cc kenalog 10. Disposition: Patient tolerated procedure well. Injection site dressed with a  band-aid.  Post-injection care was discussed and return precautions discussed.   Vivi BarrackMatthew R Rillie Riffel DPM

## 2018-03-15 ENCOUNTER — Other Ambulatory Visit: Payer: Self-pay | Admitting: *Deleted

## 2018-03-30 ENCOUNTER — Other Ambulatory Visit: Payer: Self-pay | Admitting: *Deleted

## 2018-03-30 MED ORDER — ESOMEPRAZOLE MAGNESIUM 40 MG PO CPDR: 40.0000 mg | DELAYED_RELEASE_CAPSULE | Freq: Every day | ORAL | 0 refills | Status: DC

## 2018-03-30 MED ORDER — FLUTICASONE PROPIONATE 50 MCG/ACT NA SUSP: 2.0000 | Freq: Every day | NASAL | 1 refills | Status: DC

## 2018-03-30 MED ORDER — ESOMEPRAZOLE MAGNESIUM 40 MG PO CPDR: 40.0000 mg | DELAYED_RELEASE_CAPSULE | Freq: Every day | ORAL | 3 refills | Status: DC

## 2018-04-04 ENCOUNTER — Ambulatory Visit: Payer: 59 | Admitting: Podiatry

## 2018-04-15 ENCOUNTER — Encounter: Payer: Self-pay | Admitting: Podiatry

## 2018-04-15 ENCOUNTER — Ambulatory Visit (INDEPENDENT_AMBULATORY_CARE_PROVIDER_SITE_OTHER): Payer: 59 | Admitting: Podiatry

## 2018-04-15 DIAGNOSIS — M722 Plantar fascial fibromatosis: Secondary | ICD-10-CM | POA: Diagnosis not present

## 2018-04-17 NOTE — Progress Notes (Signed)
Subjective: Tavaris presents to the office today for follow-up evaluation of right heel pain, plantar fasciitis.  He states he is doing better but he Milliner getting pain in the bottom of his heel.  He has not been running in the last couple of months since this started medial to get back into running.  He states that he was having pain yesterday but he cannot recall any change in activity or any change to his shoes which made the pain worse yesterday.  Denies any swelling or redness.  No numbness or tingling.  No other concerns today.  Denies any systemic complaints such as fevers, chills, nausea, vomiting.  No acute changes.  Objective: AAO x3, NAD DP/PT pulses palpable bilaterally, CRT less than 3 seconds There is [place]vel[place] tenderness palpation of the plantar medial tubercle of the calcaneus at the insertion of the plantar fascia on the right foot.  There is no discomfort on the medial band within the arch of the foot.  Plantar fascia appears to be intact.  Achilles tendon appears to be intact.  No overlying edema, erythema, increased warmth.  Negative Tinel sign.  No other areas of tenderness. No open lesions or pre-ulcerative lesions.  No pain with calf compression, swelling, warmth, erythema  Assessment: Right foot plantar fasciitis  Plan: -All treatment options discussed with the patient including all alternatives, risks, complications.  -The third steroid injection was performed.  See procedure note below.  Continue stretching, ice and exercises daily.  Also discussed his renal issues as well as orthotics.  Also discussed physical therapy.  Prescription was written for this today. -Patient encouraged to call the office with any questions, concerns, change in symptoms.   Procedure: Injection Tendon/Ligament Discussed alternatives, risks, complications and verbal consent was obtained.  Location: Right plantar fascia at the glabrous junction; medial approach. Skin Prep: Alcohol. Injectate: 0.5 cc  0.5% marcaine plain, 0.5 cc 2% lidocaine plain and, 1 cc kenalog 10. Disposition: Patient tolerated procedure well. Injection site dressed with a band-aid.  Post-injection care was discussed and return precautions discussed.   [name]

## 2018-04-18 NOTE — Addendum Note (Signed)
Addended by: Alphia Kava D on: 04/18/2018 12:03 PM   Modules accepted: Orders

## 2018-05-16 ENCOUNTER — Ambulatory Visit: Payer: 59 | Admitting: Podiatry

## 2018-05-18 ENCOUNTER — Ambulatory Visit: Payer: 59 | Admitting: Podiatry

## 2018-05-26 ENCOUNTER — Ambulatory Visit (INDEPENDENT_AMBULATORY_CARE_PROVIDER_SITE_OTHER): Payer: 59 | Admitting: Podiatry

## 2018-05-26 DIAGNOSIS — M722 Plantar fascial fibromatosis: Secondary | ICD-10-CM

## 2018-05-26 MED ORDER — DICLOFENAC SODIUM 1 % TD GEL: 2.0000 g | Freq: Four times a day (QID) | TRANSDERMAL | 2 refills | Status: DC

## 2018-05-29 NOTE — Progress Notes (Signed)
Subjective: Mason Rangel presents to the office today for follow-up evaluation of right heel pain, plantar fasciitis.  He states that he has been doing much better.  He has been trying get back into running and doing more exercise.  He states he was not having any pain to his foot until today when he came in.  Denies any recent injury or fall denies any swelling or redness.  No other concerns today.   Denies any systemic complaints such as fevers, chills, nausea, vomiting.  No acute changes.  Objective: AAO x3, NAD DP/PT pulses palpable bilaterally, CRT less than 3 seconds There is minimal tenderness palpation of the plantar medial tubercle of the calcaneus at the insertion of the plantar fascia on the right foot.  There is no discomfort on the medial band within the arch of the foot.  Plantar fascia appears to be intact.  Achilles tendon appears to be intact.  No overlying edema, erythema, increased warmth.  Negative Tinel sign.  No other areas of tenderness. No open lesions or pre-ulcerative lesions.  No pain with calf compression, swelling, warmth, erythema  Assessment: Right foot plantar fasciitis  Plan: -All treatment options discussed with the patient including all alternatives, risks, complications.  -He has already had 3 steroid injections will can hold off on this.  Did prescribe Voltaren gel for him to use topically.  Encouraged to continue with stretching, icing.  As he is getting back to exercise on the discharge could before and after as well as go ahead and ice the area after to help prevent pain.  He is to continue with this treatment for now.  He is also changing shoes which is been helpful.  Mason Rangel DPM

## 2018-06-14 ENCOUNTER — Other Ambulatory Visit: Payer: Self-pay

## 2018-06-14 DIAGNOSIS — F5101 Primary insomnia: Secondary | ICD-10-CM

## 2018-06-14 MED ORDER — ZOLPIDEM TARTRATE 10 MG PO TABS: 10.0000 mg | ORAL_TABLET | Freq: Every evening | ORAL | 0 refills | Status: DC | PRN

## 2018-06-14 NOTE — Telephone Encounter (Signed)
Controlled substance. -[facility] 

## 2018-07-17 ENCOUNTER — Emergency Department (HOSPITAL_BASED_OUTPATIENT_CLINIC_OR_DEPARTMENT_OTHER)
Admission: EM | Admit: 2018-07-17 | Discharge: 2018-07-18 | Disposition: A | Discharge status: Home / Self Care | Payer: BLUE CROSS/BLUE SHIELD | Attending: Emergency Medicine | Admitting: Emergency Medicine

## 2018-07-17 ENCOUNTER — Encounter (HOSPITAL_BASED_OUTPATIENT_CLINIC_OR_DEPARTMENT_OTHER): Payer: Self-pay | Admitting: Emergency Medicine

## 2018-07-17 ENCOUNTER — Other Ambulatory Visit: Payer: Self-pay

## 2018-07-17 ENCOUNTER — Emergency Department (HOSPITAL_BASED_OUTPATIENT_CLINIC_OR_DEPARTMENT_OTHER): Payer: BLUE CROSS/BLUE SHIELD

## 2018-07-17 DIAGNOSIS — M545 Low back pain: Secondary | ICD-10-CM | POA: Diagnosis present

## 2018-07-17 DIAGNOSIS — Z79899 Other long term (current) drug therapy: Secondary | ICD-10-CM | POA: Diagnosis not present

## 2018-07-17 DIAGNOSIS — M549 Dorsalgia, unspecified: Secondary | ICD-10-CM

## 2018-07-17 DIAGNOSIS — I10 Essential (primary) hypertension: Secondary | ICD-10-CM | POA: Diagnosis not present

## 2018-07-17 DIAGNOSIS — F4323 Adjustment disorder with mixed anxiety and depressed mood: Secondary | ICD-10-CM | POA: Insufficient documentation

## 2018-07-17 DIAGNOSIS — F419 Anxiety disorder, unspecified: Secondary | ICD-10-CM | POA: Insufficient documentation

## 2018-07-17 DIAGNOSIS — R1030 Lower abdominal pain, unspecified: Secondary | ICD-10-CM | POA: Insufficient documentation

## 2018-07-17 LAB — COMPREHENSIVE METABOLIC PANEL
ALT: 26 U/L (ref 0–44)
AST: 30 U/L (ref 15–41)
Albumin: 4 g/dL (ref 3.5–5.0)
Alkaline Phosphatase: 49 U/L (ref 38–126)
Anion gap: 9 (ref 5–15)
BUN: 21 mg/dL — ABNORMAL HIGH (ref 6–20)
CO2: 27 mmol/L (ref 22–32)
Calcium: 9 mg/dL (ref 8.9–10.3)
Chloride: 101 mmol/L (ref 98–111)
Creatinine, Ser: 1.29 mg/dL — ABNORMAL HIGH (ref 0.61–1.24)
GFR calc Af Amer: >60 mL/min (ref >60)
GFR calc non Af Amer: >60 mL/min (ref >60)
Glucose, Bld: 90 mg/dL (ref 70–99)
Potassium: 3.8 mmol/L (ref 3.5–5.1)
Sodium: 137 mmol/L (ref 135–145)
Total Bilirubin: 0.4 mg/dL (ref 0.3–1.2)
Total Protein: 7.3 g/dL (ref 6.5–8.1)

## 2018-07-17 LAB — URINALYSIS, ROUTINE W REFLEX MICROSCOPIC
Bilirubin Urine: NEGATIVE
Glucose, UA: NEGATIVE mg/dL
Hgb urine dipstick: NEGATIVE
Ketones, ur: NEGATIVE mg/dL
Leukocytes, UA: NEGATIVE
Nitrite: NEGATIVE
Protein, ur: NEGATIVE mg/dL
Specific Gravity, Urine: 1.015 (ref 1.005–1.030)
pH: 5.5 (ref 5.0–8.0)

## 2018-07-17 LAB — LIPASE, BLOOD: Lipase: 47 U/L (ref 11–51)

## 2018-07-17 LAB — CBC WITH DIFFERENTIAL/PLATELET
Basophils Absolute: 0 10*3/uL (ref 0.0–0.1)
Basophils Relative: 1 %
Eosinophils Absolute: 0.1 10*3/uL (ref 0.0–0.7)
Eosinophils Relative: 1 %
HCT: 40.2 % (ref 39.0–52.0)
Hemoglobin: 14.5 g/dL (ref 13.0–17.0)
Lymphocytes Relative: 29 %
Lymphs Abs: 1.8 10*3/uL (ref 0.7–4.0)
MCH: 31.9 pg (ref 26.0–34.0)
MCHC: 36.1 g/dL — ABNORMAL HIGH (ref 30.0–36.0)
MCV: 88.5 fL (ref 78.0–100.0)
Monocytes Absolute: 0.6 10*3/uL (ref 0.1–1.0)
Monocytes Relative: 9 %
Neutro Abs: 3.7 10*3/uL (ref 1.7–7.7)
Neutrophils Relative %: 60 %
Platelets: 208 10*3/uL (ref 150–400)
RBC: 4.54 MIL/uL (ref 4.22–5.81)
RDW: 13.4 % (ref 11.5–15.5)
WBC: 6.1 10*3/uL (ref 4.0–10.5)

## 2018-07-17 MED ORDER — METHOCARBAMOL 1000 MG/10ML IJ SOLN
INTRAMUSCULAR | Status: AC
  Filled 2018-07-17: qty 10

## 2018-07-17 MED ORDER — DEXTROSE 5 % IV SOLN
1000.0000 mg | Freq: Once | INTRAVENOUS | Status: AC
  Administered 2018-07-17: 1000 mg via INTRAVENOUS
  Filled 2018-07-17: qty 10

## 2018-07-17 MED ORDER — METHOCARBAMOL 500 MG PO TABS: 500.0000 mg | ORAL_TABLET | Freq: Two times a day (BID) | ORAL | 0 refills | Status: AC

## 2018-07-17 MED ORDER — METHOCARBAMOL 1000 MG/10ML IJ SOLN: 1000.0000 mg | Freq: Once | INTRAMUSCULAR | Status: DC

## 2018-07-17 NOTE — Discharge Instructions (Addendum)
You may alternate taking Tylenol and Ibuprofen as needed for pain control. You may take 400-600 mg of ibuprofen every 6 hours and [phone] mg of Tylenol every 6 hours. Do not exceed 4000 mg of Tylenol daily as this can lead to liver damage. Also, make sure to take Ibuprofen with meals as it can cause an upset stomach. Do not take other NSAIDs while taking Ibuprofen such as (Aleve, Naprosyn, Aspirin, Celebrex, etc) and do not take more than the prescribed dose as this can lead to ulcers and bleeding in your GI tract. You may use warm and cold compresses to help with your symptoms.   You were given a prescription for Robaxin which is a muscle relaxer.  You should not drive, work, or operate machinery while taking this medication as it can make you very drowsy.    Please follow up with your primary doctor within the next 7-10 days for re-evaluation and further treatment of your symptoms.   These follow-up with the cardiologist in regards to your palpitations.  Please return to the ER sooner if you have any new or worsening symptoms, or if you have any of the following symptoms:  Abdominal pain that does not go away.  You have a fever.  You keep throwing up (vomiting).  The pain is felt only in portions of the abdomen. Pain in the right side could possibly be appendicitis. In an adult, pain in the left lower portion of the abdomen could be colitis or diverticulitis.  You pass bloody or black tarry stools.  There is bright red blood in the stool.  The constipation stays for more than 4 days.  There is belly (abdominal) or rectal pain.  You do not seem to be getting better.  You have any questions or concerns.

## 2018-07-17 NOTE — ED Notes (Signed)
Patient let the registration staff know that he was starting to have flutter in his chest. The patient brought into triage and HR is 177. The patient is alert and oriented at this time - patient states that he has flutter every once in a while but it has never lasted this long

## 2018-07-17 NOTE — ED Provider Notes (Addendum)
[facility] EMERGENCY DEPARTMENT Provider Note   CSN: [id number] Arrival date & time: 07/17/18  1912     History   Chief Complaint Chief Complaint  Patient presents with  . Back Pain  . Tachycardia    HPI Mason Rangel is a 52 y.o. male.  HPI  Pt Is a 52 year old male with a history of anxiety, gout, hypertension, who presents emergency department today for evaluation of bilateral low back and flank pain that began earlier today.  Patient states that he bent over and felt a numbness in his back that radiated around to his abdomen.  Rated pain 9/10.  Pain constant and feels like he has pain "in my core ".  States that symptoms felt similar to when he had a kidney stone in the past.  Denies any dysuria, frequency, urgency, hematuria or fevers.  No nausea or vomiting.  No diarrhea or constipation.  States that while he was waiting in the waiting room he began to have palpitations and his heart rate jumped up to 177 on his apple watch.  He states that he has had palpitations in the past.  States this is happened probably about once every other month for a long period of time.  Denies any associated chest pain, shortness of breath.  Did feel somewhat lightheaded when this occurred.  Is never been worked up for this in the past.  Eyes any numbness or weakness to his arms or legs.  No loss of control of bowel or bladder function.  No urinary retention.  No history of IV drug use or cancer.  Past Medical History:  Diagnosis Date  . Anxiety   . Gout   . Hypertension     Patient Active Problem List   Diagnosis Date Noted  . Plantar fasciitis 12/28/2017  . Hypertension, uncontrolled 11/22/2017  . Diastolic dysfunction 04/28/2017  . LVH (left ventricular hypertrophy) due to hypertensive disease, without heart failure 03/24/2017  . Hypogonadism in male 10/19/2016  . Swelling of foot joint 07/10/2016  . Right foot pain 07/10/2016  . Left sided chest pain 04/06/2016  .  Anxiety 04/06/2016  . Insomnia 04/06/2016  . Adjustment disorder with mixed anxiety and depressed mood 04/06/2016  . Essential hypertension, benign 04/06/2016  . GERD (gastroesophageal reflux disease) 04/06/2016  . Esophageal stenosis 04/06/2016    Past Surgical History:  Procedure Laterality Date  . ESOPHAGEAL DILATION    . upper endoscopy with dilation  2009        Home Medications    Prior to Admission medications   Medication Sig Start Date End Date Taking? Authorizing Provider  clonazePAM (KLONOPIN) 0.5 MG tablet Take 1 tablet (0.5 mg total) by mouth 2 (two) times daily. 03/24/17   Rodolph Bong, MD  dexlansoprazole (DEXILANT) 60 MG capsule Take 1 capsule (60 mg total) by mouth daily. Patient taking differently: Take 30 mg by mouth daily.  10/21/16   Breeback, Lesly Rubenstein L, PA-C  diclofenac sodium (VOLTAREN) 1 % GEL Apply 2 g topically 4 (four) times daily. Rub into affected area of foot 2 to 4 times daily 05/26/18   Vivi Barrack, DPM  esomeprazole (NEXIUM) 40 MG capsule Take 1 capsule (40 mg total) by mouth daily. 03/30/18   Breeback, Jade L, PA-C  fluticasone (FLONASE) 50 MCG/ACT nasal spray Place 2 sprays into both nostrils daily. 03/30/18   Breeback, Lonna Cobb, PA-C  methocarbamol (ROBAXIN) 500 MG tablet Take 1 tablet (500 mg total) by mouth 2 (two) times  daily for 5 days. 07/17/18 07/22/18  Jeson Camacho S, PA-C  olmesartan-hydrochlorothiazide (BENICAR HCT) 40-12.5 MG tablet Take 1 tablet by mouth daily. 02/12/18   Breeback, Jade L, PA-C  testosterone cypionate (DEPOTESTOTERONE CYPIONATE) 100 MG/ML injection Inject into the muscle every 7 (seven) days. For IM use only    [provider]  traZODone (DESYREL) 50 MG tablet Take 1-2 tablets (50-100 mg total) by mouth at bedtime as needed for sleep. Patient not taking: Reported on 12/27/2017 11/22/17   Jomarie Longs, PA-C  valACYclovir (VALTREX) 1000 MG tablet Take 1 tablet (1,000 mg total) by mouth daily. 08/24/17   Sunnie Nielsen, DO  zolpidem (AMBIEN) 10 MG tablet Take 1 tablet (10 mg total) by mouth at bedtime as needed. for sleep 06/14/18   Jomarie Longs, PA-C    Family History Family History  Problem Relation Age of Onset  . Hypertension Mother   . ALS Father   . Stroke Maternal Grandfather   . Heart attack Paternal Grandmother   . Heart attack Paternal Grandfather     Social History Social History   Tobacco Use  . Smoking status: Never Smoker  . Smokeless tobacco: Never Used  Substance Use Topics  . Alcohol use: Yes    Comment: occ  . Drug use: No     Allergies   Lorazepam and Morphine and related   Review of Systems Review of Systems  Constitutional: Negative for chills and fever.  HENT: Negative for ear pain and sore throat.   Eyes: Negative for pain and visual disturbance.  Respiratory: Negative for cough and shortness of breath.   Cardiovascular: Positive for palpitations. Negative for chest pain and leg swelling.  Gastrointestinal: Positive for abdominal pain. Negative for constipation, diarrhea, nausea and vomiting.  Genitourinary: Positive for flank pain. Negative for dysuria, hematuria, penile pain and urgency.  Musculoskeletal: Positive for back pain.  Skin: Negative for rash.  Neurological: Positive for light-headedness. Negative for weakness and numbness.  All other systems reviewed and are negative.    Physical Exam Updated Vital Signs BP (!) 147/103 (BP Location: Left Arm)   Pulse 87   Temp 98.2 F (36.8 C) (Oral)   Resp 14   Ht 6' (1.829 m)   Wt 97.5 kg   SpO2 96%   BMI 29.16 kg/m   Physical Exam  Constitutional: He appears well-developed and well-nourished.  HENT:  Head: Normocephalic and atraumatic.  Mouth/Throat: Oropharynx is clear and moist.  Eyes: Conjunctivae are normal.  Neck: Neck supple.  Cardiovascular: Normal rate and regular rhythm.  No murmur heard. Pulmonary/Chest: Effort normal and breath sounds normal. No respiratory distress.    Abdominal: Soft. Bowel sounds are normal. He exhibits no distension. There is no tenderness. There is no guarding.  Mild bilateral CVA tenderness.  Musculoskeletal: He exhibits no edema.  No cervical, thoracic or lumbar midline spinal tenderness.  No significant tenderness to palpation to the bilateral paraspinous muscles.  Neurological: He is alert.  Motor:  Normal tone. 5/5 strength of BUE and BLE major muscle groups including strong and equal grip strength and dorsiflexion/plantar flexion Sensory: light touch normal in all extremities. CV: 2+ radial and DP pulses  Skin: Skin is warm and dry.  Psychiatric: He has a normal mood and affect.  Nursing note and vitals reviewed.  ED Treatments / Results  Labs (all labs ordered are listed, but only abnormal results are displayed) Labs Reviewed  CBC WITH DIFFERENTIAL/PLATELET - Abnormal; Notable for the following components:  Result Value   MCHC 36.1 (*)    All other components within normal limits  COMPREHENSIVE METABOLIC PANEL - Abnormal; Notable for the following components:   BUN 21 (*)    Creatinine, Ser 1.29 (*)    All other components within normal limits  URINALYSIS, ROUTINE W REFLEX MICROSCOPIC  LIPASE, BLOOD    EKG EKG Interpretation  Date/Time:  Sunday July 17 2018 20:36:20 EDT Ventricular Rate:  74 PR Interval:    QRS Duration: 101 QT Interval:  336 QTC Calculation: 373 R Axis:   -21 Text Interpretation:  Sinus arrhythmia Borderline left axis deviation Abnormal R-wave progression, early transition SVT resolved Confirmed by Pricilla LovelessGoldston, Scott [phone]([id number]) on 07/17/2018 9:34:07 PM   ED ECG REPORT   Date: 07/17/2018  Rate: 175   Rhythm: supraventricular tachycardia (SVT)  QRS Axis: normal  Intervals: normal  ST/T Wave abnormalities: normal  Conduction Disutrbances:left anterior fascicular block  Narrative Interpretation:     Radiology Ct Renal Stone Study  Result Date: 07/17/2018 CLINICAL DATA:  Bilateral  flank pain. History of kidney stones. EXAM: CT ABDOMEN AND PELVIS WITHOUT CONTRAST TECHNIQUE: Multidetector CT imaging of the abdomen and pelvis was performed following the standard protocol without IV contrast. COMPARISON:  None FINDINGS: Lower chest: No acute abnormality. Hepatobiliary: 2.5 cm segment 6 liver cyst noted. Adjacent low-density structure measuring 7 mm is too small to characterize. The gallbladder appears normal. No biliary dilatation. Pancreas: Unremarkable. No pancreatic ductal dilatation or surrounding inflammatory changes. Spleen: Normal in size without focal abnormality. Adrenals/Urinary Tract: Normal adrenal glands. The right kidney is malrotated. No kidney stones identified bilaterally. No hydronephrosis or hydroureter. No ureteral lithiasis. Urinary bladder appears normal. Stomach/Bowel: Stomach appears nondistended. The small bowel loops have a normal course and caliber. No wall thickening or inflammation. The appendix is visualized containing a 5 mm appendicolith. No appendiceal thickening or inflammation. Unremarkable appearance of the colon. Vascular/Lymphatic: A Normal appearance of the abdominal aorta. No enlarged retroperitoneal or mesenteric adenopathy. No enlarged pelvic or inguinal lymph nodes. Reproductive: Prostate is unremarkable. Other: Small fat containing umbilical hernia noted. No abdominopelvic ascites. Musculoskeletal: No acute or significant osseous findings. IMPRESSION: 1. No acute findings within the abdomen or pelvis. 2. No kidney stones or evidence of obstructive uropathy. 3. Liver cysts. Electronically Signed   By: Signa Kellaylor  Stroud M.D.   On: 07/17/2018 22:03    Procedures Procedures (including critical care time)  Medications Ordered in ED Medications  methocarbamol (ROBAXIN) 1,000 mg in dextrose 5 % 50 mL IVPB (1,000 mg Intravenous New Bag/Given 07/17/18 2318)  methocarbamol (ROBAXIN) 1000 MG/10ML injection (has no administration in time range)     Initial  Impression / Assessment and Plan / ED Course  I have reviewed the triage vital signs and the nursing notes.  Pertinent labs & imaging results that were available during my care of the patient were reviewed by me and considered in my medical decision making (see chart for details).  On initial evaluation patient was tachycardic to the 180s and appeared to be in SVT on the monitor.  Vagal maneuvers were performed and he converted to normal sinus rhythm with a heart rate in the 90s.  Stated that his palpitations resolved after that.  He denies chest pain or shortness of breath at any point time.  Discussed pt presentation and exam findings with Dr. Criss AlvineGoldston, who agrees with the plan to discharge the patient home with close outpatient follow-up.  He recommends having the patient follow-up with cardiology as an outpatient  given his episode of SVT today.   Final Clinical Impressions(s) / ED Diagnoses   Final diagnoses:  Back pain, unspecified back location, unspecified back pain laterality, unspecified chronicity   Patient presented the ED today for evaluation of bilateral flank pain and low back pain that radiated around to the abdomen today.  No associated GI or urinary symptoms.  While in the waiting room he developed palpitations and was found to be in SVT with heart rate to 180.  During this time he had no chest pain or shortness of breath.  He did feel somewhat lightheaded after completing vagal remover maneuvers.  His palpitations resolved after he was found to be in normal sinus rhythm on the monitor.  He had no recurrence of symptoms while in the emergency department. Initial ECG with SVT. Repeat ECG after vagal maneuvers showed NSR.  Laboratory work was obtained and patient has normal electrolytes.  His creatinine is mildly elevated however it appears that he has had elevations in the past.  His BUN is also elevated, this could be related to dehydration as he stated that he exercised today.  CBC  is without leukocytosis or anemia.  UA is without evidence of urinary tract infection.  Lipase is normal.  CT renal study did not show evidence of nephrolithiasis.  No evidence of muscular skeletal changes in the back to suggest a bony abnormality to cause his symptoms.  Suspect that he is either having muscle spasms or strained a muscle while working out today.  Advised supportive care with Tylenol, Motrin, rice protocol.  He has no red flag signs or symptoms at this time.  Advised to follow-up with cardiology in regards to his palpitations today.  He does note that he has had palpitations that were similar multiple times in the past but has never followed up.  Feel he is safe for outpatient follow-up with regards to this.  Advised him to follow-up with his PCP in regards to his back pain and to return to the ER if he has any new or worsening symptoms in the meantime.  He voices understanding of plan reasons to return immediately to the ED.  All questions answered.  ED Discharge Orders         Ordered    methocarbamol (ROBAXIN) 500 MG tablet  2 times daily     07/17/18 2302           Karrie Meres, PA-C 07/17/18 2237    Pricilla Loveless, MD 07/17/18 2341    Samson Frederic, Lasondra Hodgkins S, PA-C 07/17/18 2348    Pricilla Loveless, MD 07/20/18 [phone]

## 2018-07-17 NOTE — ED Triage Notes (Signed)
Patient states that he has pain all across his back . He states that pain started at about 2 pm - denies any N/V -

## 2018-07-18 ENCOUNTER — Ambulatory Visit (INDEPENDENT_AMBULATORY_CARE_PROVIDER_SITE_OTHER): Payer: BLUE CROSS/BLUE SHIELD | Admitting: Physician Assistant

## 2018-07-18 ENCOUNTER — Telehealth: Payer: Self-pay | Admitting: Physician Assistant

## 2018-07-18 VITALS — BP 144/96 | HR 88 | Wt 221.0 lb

## 2018-07-18 DIAGNOSIS — M549 Dorsalgia, unspecified: Secondary | ICD-10-CM

## 2018-07-18 DIAGNOSIS — M545 Low back pain, unspecified: Secondary | ICD-10-CM

## 2018-07-18 DIAGNOSIS — M62830 Muscle spasm of back: Secondary | ICD-10-CM

## 2018-07-18 DIAGNOSIS — I471 Supraventricular tachycardia, unspecified: Secondary | ICD-10-CM | POA: Insufficient documentation

## 2018-07-18 DIAGNOSIS — M62838 Other muscle spasm: Secondary | ICD-10-CM

## 2018-07-18 MED ORDER — KETOROLAC TROMETHAMINE 60 MG/2ML IM SOLN
60.0000 mg | Freq: Once | INTRAMUSCULAR | Status: AC
  Administered 2018-07-18: 60 mg via INTRAMUSCULAR

## 2018-07-18 NOTE — Progress Notes (Signed)
   Subjective:    Patient ID: Mason Rangel, male    DOB: 30-Dec-1965, 52 y.o.   MRN: [id number]  HPI  Mason Rangel complains of back pain.   Review of Systems     Objective:   Physical Exam        Assessment & Plan:  Back pain- Per Mason Rangel, Toradol 60 mg IM. See telephone note for post ED follow up.

## 2018-07-18 NOTE — Telephone Encounter (Signed)
Pt was in ED last night with SVT and back pain. Work up was negative for any pathology for back pain. Likely muscle spasms. Will get set up for PT and he will come in for nurse visit today for Toradol 60mg  IM. I will also go ahead and make referral for cardiology for SVT follow up.

## 2018-08-02 ENCOUNTER — Ambulatory Visit: Payer: BLUE CROSS/BLUE SHIELD | Admitting: Cardiology

## 2018-08-09 DIAGNOSIS — I348 Other nonrheumatic mitral valve disorders: Secondary | ICD-10-CM | POA: Insufficient documentation

## 2018-08-09 DIAGNOSIS — R55 Syncope and collapse: Secondary | ICD-10-CM | POA: Insufficient documentation

## 2018-08-09 DIAGNOSIS — N451 Epididymitis: Secondary | ICD-10-CM | POA: Insufficient documentation

## 2018-08-09 DIAGNOSIS — B009 Herpesviral infection, unspecified: Secondary | ICD-10-CM | POA: Insufficient documentation

## 2018-08-09 DIAGNOSIS — M545 Low back pain, unspecified: Secondary | ICD-10-CM | POA: Insufficient documentation

## 2018-08-09 DIAGNOSIS — R509 Fever, unspecified: Secondary | ICD-10-CM | POA: Insufficient documentation

## 2018-08-09 DIAGNOSIS — R42 Dizziness and giddiness: Secondary | ICD-10-CM | POA: Insufficient documentation

## 2018-08-09 DIAGNOSIS — I3489 Other nonrheumatic mitral valve disorders: Secondary | ICD-10-CM | POA: Insufficient documentation

## 2018-08-09 DIAGNOSIS — A5139 Other secondary syphilis of skin: Secondary | ICD-10-CM | POA: Insufficient documentation

## 2018-08-12 ENCOUNTER — Encounter: Payer: Self-pay | Admitting: Cardiology

## 2018-08-12 ENCOUNTER — Ambulatory Visit (INDEPENDENT_AMBULATORY_CARE_PROVIDER_SITE_OTHER): Payer: BLUE CROSS/BLUE SHIELD | Admitting: Cardiology

## 2018-08-12 VITALS — BP 148/90 | HR 104 | Ht 72.0 in | Wt 225.4 lb

## 2018-08-12 DIAGNOSIS — Z13220 Encounter for screening for lipoid disorders: Secondary | ICD-10-CM | POA: Diagnosis not present

## 2018-08-12 DIAGNOSIS — R002 Palpitations: Secondary | ICD-10-CM | POA: Diagnosis not present

## 2018-08-12 DIAGNOSIS — I10 Essential (primary) hypertension: Secondary | ICD-10-CM

## 2018-08-12 NOTE — Addendum Note (Signed)
Addended by: Carren Rang on: 08/12/2018 04:22 PM   Modules accepted: Orders

## 2018-08-12 NOTE — Patient Instructions (Addendum)
Medication Instructions:  Your physician recommends that you continue on your current medications as directed. Please refer to the Current Medication list given to you today.  Labwork: Your physician recommends that you have the following labs drawn: CBC, BMP, TSH, liver and lipid panel.  Testing/Procedures: Your physician has requested that you have a stress echocardiogram. For further information please visit [email or web address] Please follow instruction sheet as given.  Follow-Up: Your physician recommends that you schedule a follow-up appointment in: 6 months  Any Other Special Instructions Will Be Listed Below (If Applicable).     If you need a refill on your cardiac medications before your next appointment, please call your pharmacy.   [facility]  Garey Ham, RN, BSN   Cardiopulmonary Exercise Stress Test Cardiopulmonary exercise testing (CPET) is a test that checks how your heart and lungs react to exercise. This is called your exercise capacity. During this test, you will walk or run on a treadmill or pedal on a stationary bike while tests are done on your heart and lungs. You may have this test to:  See why you are short of breath.  Check for exercise intolerance.  See how your lungs work.  See how your heart works.  Check for how you are responding to a heart or lung rehabilitation program.  See if you have a heart or lung problem.  See if you are healthy enough to have surgery.  What happens before the procedure?  Follow instructions from your doctor about what you cannot eat or drink.  Ask your doctor about changing or stopping your normal medicines. This is important if you take diabetes medicines or blood thinners.  Wear loose, comfortable clothing and shoes.  If you use an inhaler, bring it with you to the test. What happens during the procedure?  A blood pressure cuff will be placed on your arm.  Several stick-on patches (electrodes) will be  placed on your chest. They will be attached to an electrocardiogram (EKG) machine.  A clip-on monitor that measures the amount of oxygen in your blood will be placed on your finger (pulse oximeter).  A clip will be placed on your nose and a mouthpiece will be placed in your mouth. This may be held in place with a headpiece. You will breathe through the mouthpiece during the test.  You will be asked to start exercising. You will be closely watched while you exercise.  The amount of effort for your exercise will be gradually increased.  During exercise, the test will measure: ? Your heart rate. ? Your heart rhythm. ? Your oxygen blood level. ? The amount of oxygen and carbon dioxide that you breathe out.  The test will end when: ? You have finished the test. ? You have reached your maximum ability to exercise. ? You have chest or leg pain, dizziness, or shortness of breath. The procedure may vary among doctors and hospitals. What happens after the procedure?  Your blood pressure and EKG will be checked to watch how you recover from the test. This information is not intended to replace advice given to you by your health care provider. Make sure you discuss any questions you have with your health care provider. Document Released: 11/11/2009 Document Revised: 04/14/2016 Document Reviewed: 10/07/2015 Elsevier Interactive Patient Education  2018 [employer].

## 2018-08-12 NOTE — Addendum Note (Signed)
Addended by: Craige Cotta on: 08/12/2018 04:26 PM   Modules accepted: Orders

## 2018-08-12 NOTE — Progress Notes (Signed)
Cardiology Office Note:    Date:  08/12/2018   ID:  Mason Rangel, DOB 1966/04/13, MRN [id number]  PCP:  Mason Longs, PA-C  Cardiologist:  Mason Brothers, MD   Referring MD: Mason Longs, PA-C    ASSESSMENT:    1. Palpitations   2. Screening for cholesterol level   3. Essential hypertension, benign    PLAN:    In order of problems listed above:  1. Primary prevention stressed with the patient.  Importance of compliance with diet and medication stressed and he vocalized understanding.  His blood pressure is stable.  Is elevated today as he has not taken his medications.  Generally is very prompt about his medication I cautioned him about compliance. 2. He will have blood work including TSH for palpitations.  I will also do fasting lipids. 3. Echocardiogram will be done to assess murmur heard on auscultation.  This will also help me assess left ventricular size and thickness in view of hypertension. 4. His palpitations and SVT is not an issue for him from a symptom standpoint at this time and I will not intervene on it at this time. 5. Patient will be seen in follow-up appointment in 6 months or earlier if the patient has any concerns    Medication Adjustments/Labs and Tests Ordered: Current medicines are reviewed at length with the patient today.  Concerns regarding medicines are outlined above.  Orders Placed This Encounter  Procedures  . Basic metabolic panel  . CBC with Differential/Platelet  . TSH  . Hepatic function panel  . Lipid panel  . ECHOCARDIOGRAM STRESS TEST   No orders of the defined types were placed in this encounter.    History of Present Illness:    Mason Rangel is a 52 y.o. male who is being seen today for the evaluation of essential hypertension and palpitations at the request of Mason Longs, PA-C.  Patient is a pleasant 52 year old male.  He has past medical history of essential hypertension.  He has not taken his medication  for the past day or 2 as he ran out of his medications.  He also has history of supraventricular tachycardia which he is aware of his palpitations at times and not much of a problem.  No chest pain orthopnea or PND.  He occasionally has some chest pains stabbing-like nature not related to exertion and it is very sporadic.  He is active gentleman and exercises on a regular basis.  At the time of my evaluation, the patient is alert awake oriented and in no distress.  Past Medical History:  Diagnosis Date  . Anxiety   . Gout   . Hypertension     Past Surgical History:  Procedure Laterality Date  . ESOPHAGEAL DILATION    . upper endoscopy with dilation  2009    Current Medications: Current Meds  Medication Sig  . azelastine (ASTELIN) 0.1 % nasal spray azelastine 137 mcg (0.1 %) nasal spray aerosol  . BIOTIN PO Take by mouth.  . clonazePAM (KLONOPIN) 0.5 MG tablet Take 1 tablet (0.5 mg total) by mouth 2 (two) times daily.  . DEXLANSOPRAZOLE PO Take by mouth.  . diclofenac sodium (VOLTAREN) 1 % GEL Apply 2 g topically 4 (four) times daily. Rub into affected area of foot 2 to 4 times daily  . esomeprazole (NEXIUM) 40 MG capsule Take 1 capsule (40 mg total) by mouth daily.  . fluticasone (FLONASE) 50 MCG/ACT nasal spray Place 2 sprays into  both nostrils daily.  [name] Kitchen olmesartan-hydrochlorothiazide (BENICAR HCT) 40-12.5 MG tablet Take 1 tablet by mouth daily.  [name] Kitchen testosterone cypionate (DEPOTESTOTERONE CYPIONATE) 100 MG/ML injection Inject into the muscle every 7 (seven) days. For IM use only  . traZODone (DESYREL) 50 MG tablet Take 1-2 tablets (50-100 mg total) by mouth at bedtime as needed for sleep.  . valACYclovir (VALTREX) 1000 MG tablet Take 1 tablet (1,000 mg total) by mouth daily.  [name] Kitchen zolpidem (AMBIEN) 10 MG tablet Take 1 tablet (10 mg total) by mouth at bedtime as needed. for sleep     Allergies:   Morphine; Lorazepam; and Morphine and related   Social History   Socioeconomic History    . Marital status: Divorced    Spouse name: Not on file  . Number of children: Not on file  . Years of education: Not on file  . Highest education level: Not on file  Occupational History  . Not on file  Social Needs  . Financial resource strain: Not on file  . Food insecurity:    Worry: Not on file    Inability: Not on file  . Transportation needs:    Medical: Not on file    Non-medical: Not on file  Tobacco Use  . Smoking status: Never Smoker  . Smokeless tobacco: Never Used  Substance and Sexual Activity  . Alcohol use: Yes    Alcohol/week: 2.0 standard drinks    Types: 2 Glasses of wine per week    Comment: occ  . Drug use: No  . Sexual activity: Not on file  Lifestyle  . Physical activity:    Days per week: Not on file    Minutes per session: Not on file  . Stress: Not on file  Relationships  . Social connections:    Talks on phone: Not on file    Gets together: Not on file    Attends religious service: Not on file    Active member of club or organization: Not on file    Attends meetings of clubs or organizations: Not on file    Relationship status: Not on file  Other Topics Concern  . Not on file  Social History Narrative  . Not on file     Family History: The patient's family history includes ALS in his father; Heart attack in his paternal grandfather and paternal grandmother; Hypertension in his mother; Stroke in his maternal grandfather.  ROS:   Please see the history of present illness.    All other systems reviewed and are negative.  EKGs/Labs/Other Studies Reviewed:    The following studies were reviewed today: I discussed my findings with the patient at length.  I reviewed reports including medication.   Recent Labs: 07/17/2018: ALT 26; BUN 21; Creatinine, Ser 1.29; Hemoglobin 14.5; Platelets 208; Potassium 3.8; Sodium 137  Recent Lipid Panel    Component Value Date/Time   CHOL 163 11/22/2017 1105   TRIG 99 11/22/2017 1105   HDL 35 (L)  11/22/2017 1105   CHOLHDL 4.7 11/22/2017 1105   VLDL 14 10/19/2016 0850   LDLCALC 109 (H) 11/22/2017 1105    Physical Exam:    VS:  BP (!) 148/90   Pulse (!) 104   Ht 6' (1.829 m)   Wt 225 lb 6.4 oz (102.2 kg)   BMI 30.57 kg/m     Wt Readings from Last 3 Encounters:  08/12/18 225 lb 6.4 oz (102.2 kg)  07/18/18 221 lb (100.2 kg)  07/17/18 215 lb (97.5  kg)     GEN: Patient is in no acute distress HEENT: Normal NECK: No JVD; No carotid bruits LYMPHATICS: No lymphadenopathy CARDIAC: S1 S2 regular, 2/6 systolic murmur at the apex. RESPIRATORY:  Clear to auscultation without rales, wheezing or rhonchi  ABDOMEN: Soft, non-tender, non-distended MUSCULOSKELETAL:  No edema; No deformity  SKIN: Warm and dry NEUROLOGIC:  Alert and oriented x 3 PSYCHIATRIC:  Normal affect    Signed, Mason Brothers, MD  08/12/2018 1:21 PM    [facility]

## 2018-08-16 LAB — BASIC METABOLIC PANEL
BUN/Creatinine Ratio: 16 (ref 9–20)
BUN: 17 mg/dL (ref 6–24)
CO2: 23 mmol/L (ref 20–29)
Calcium: 9.1 mg/dL (ref 8.7–10.2)
Chloride: 104 mmol/L (ref 96–106)
Creatinine, Ser: 1.06 mg/dL (ref 0.76–1.27)
GFR calc Af Amer: 92 mL/min/{1.73_m2} (ref >59)
GFR calc non Af Amer: 80 mL/min/{1.73_m2} (ref >59)
Glucose: 102 mg/dL — ABNORMAL HIGH (ref 65–99)
Potassium: 4.3 mmol/L (ref 3.5–5.2)
Sodium: 141 mmol/L (ref 134–144)

## 2018-08-16 LAB — LIPID PANEL
Chol/HDL Ratio: 4.5 ratio (ref 0.0–5.0)
Cholesterol, Total: 167 mg/dL (ref 100–199)
HDL: 37 mg/dL — ABNORMAL LOW (ref >39)
LDL Calculated: 108 mg/dL — ABNORMAL HIGH (ref 0–99)
Triglycerides: 112 mg/dL (ref 0–149)
VLDL Cholesterol Cal: 22 mg/dL (ref 5–40)

## 2018-08-16 LAB — HEPATIC FUNCTION PANEL
ALT: 19 IU/L (ref 0–44)
AST: 27 IU/L (ref 0–40)
Albumin: 4.3 g/dL (ref 3.5–5.5)
Alkaline Phosphatase: 52 IU/L (ref 39–117)
Bilirubin Total: 0.5 mg/dL (ref 0.0–1.2)
Bilirubin, Direct: 0.13 mg/dL (ref 0.00–0.40)
Total Protein: 7 g/dL (ref 6.0–8.5)

## 2018-08-16 LAB — CBC WITH DIFFERENTIAL/PLATELET
Basophils Absolute: 0.1 10*3/uL (ref 0.0–0.2)
Basos: 1 %
EOS (ABSOLUTE): 0.1 10*3/uL (ref 0.0–0.4)
Eos: 3 %
Hematocrit: 44.1 % (ref 37.5–51.0)
Hemoglobin: 14.8 g/dL (ref 13.0–17.7)
Immature Grans (Abs): 0 10*3/uL (ref 0.0–0.1)
Immature Granulocytes: 0 %
Lymphocytes Absolute: 1.4 10*3/uL (ref 0.7–3.1)
Lymphs: 31 %
MCH: 30.8 pg (ref 26.6–33.0)
MCHC: 33.6 g/dL (ref 31.5–35.7)
MCV: 92 fL (ref 79–97)
Monocytes Absolute: 0.5 10*3/uL (ref 0.1–0.9)
Monocytes: 11 %
Neutrophils Absolute: 2.5 10*3/uL (ref 1.4–7.0)
Neutrophils: 54 %
Platelets: 167 10*3/uL (ref 150–450)
RBC: 4.8 x10E6/uL (ref 4.14–5.80)
RDW: 13.7 % (ref 12.3–15.4)
WBC: 4.6 10*3/uL (ref 3.4–10.8)

## 2018-08-16 LAB — TSH: TSH: 1.08 u[IU]/mL (ref 0.450–4.500)

## 2018-08-29 ENCOUNTER — Ambulatory Visit (HOSPITAL_BASED_OUTPATIENT_CLINIC_OR_DEPARTMENT_OTHER)
Admission: RE | Admit: 2018-08-29 | Discharge: 2018-08-29 | Disposition: A | Discharge status: Home / Self Care | Payer: BLUE CROSS/BLUE SHIELD | Source: Ambulatory Visit | Attending: Cardiology | Admitting: Cardiology

## 2018-08-29 DIAGNOSIS — R002 Palpitations: Secondary | ICD-10-CM | POA: Insufficient documentation

## 2018-08-29 DIAGNOSIS — I10 Essential (primary) hypertension: Secondary | ICD-10-CM | POA: Diagnosis not present

## 2018-08-29 DIAGNOSIS — R55 Syncope and collapse: Secondary | ICD-10-CM | POA: Insufficient documentation

## 2018-08-29 NOTE — Progress Notes (Signed)
  Echocardiogram Echocardiogram Stress Test has been performed.  Mason Rangel T Mason Rangel 08/29/2018, 3:16 PM

## 2018-09-12 ENCOUNTER — Other Ambulatory Visit: Payer: Self-pay

## 2018-09-12 DIAGNOSIS — F5101 Primary insomnia: Secondary | ICD-10-CM

## 2018-09-12 MED ORDER — ZOLPIDEM TARTRATE 10 MG PO TABS: 10.0000 mg | ORAL_TABLET | Freq: Every evening | ORAL | 0 refills | Status: DC | PRN

## 2018-10-26 ENCOUNTER — Other Ambulatory Visit: Payer: Self-pay | Admitting: Osteopathic Medicine

## 2018-11-11 ENCOUNTER — Encounter: Payer: Self-pay | Admitting: Physician Assistant

## 2018-11-11 ENCOUNTER — Ambulatory Visit (INDEPENDENT_AMBULATORY_CARE_PROVIDER_SITE_OTHER): Payer: BLUE CROSS/BLUE SHIELD | Admitting: Physician Assistant

## 2018-11-11 VITALS — BP 144/87 | HR 87 | Ht 72.0 in | Wt 220.0 lb

## 2018-11-11 DIAGNOSIS — Z23 Encounter for immunization: Secondary | ICD-10-CM

## 2018-11-11 DIAGNOSIS — I10 Essential (primary) hypertension: Secondary | ICD-10-CM | POA: Diagnosis not present

## 2018-11-11 DIAGNOSIS — Z0000 Encounter for general adult medical examination without abnormal findings: Secondary | ICD-10-CM | POA: Diagnosis not present

## 2018-11-11 DIAGNOSIS — K2100 Gastro-esophageal reflux disease with esophagitis, without bleeding: Secondary | ICD-10-CM

## 2018-11-11 DIAGNOSIS — F5101 Primary insomnia: Secondary | ICD-10-CM | POA: Diagnosis not present

## 2018-11-11 DIAGNOSIS — J3089 Other allergic rhinitis: Secondary | ICD-10-CM

## 2018-11-11 DIAGNOSIS — B009 Herpesviral infection, unspecified: Secondary | ICD-10-CM

## 2018-11-11 MED ORDER — FLUTICASONE PROPIONATE 50 MCG/ACT NA SUSP: 2.0000 | Freq: Every day | NASAL | 1 refills | Status: DC

## 2018-11-11 MED ORDER — OLMESARTAN MEDOXOMIL-HCTZ 40-12.5 MG PO TABS: 1.0000 | ORAL_TABLET | Freq: Every day | ORAL | 2 refills | Status: DC

## 2018-11-11 MED ORDER — VALACYCLOVIR HCL 1 G PO TABS: 1000.0000 mg | ORAL_TABLET | Freq: Every day | ORAL | 3 refills | Status: DC

## 2018-11-11 MED ORDER — TRAZODONE HCL 50 MG PO TABS: 50.0000 mg | ORAL_TABLET | Freq: Every evening | ORAL | 3 refills | Status: DC | PRN

## 2018-11-11 MED ORDER — ESOMEPRAZOLE MAGNESIUM 40 MG PO CPDR: 40.0000 mg | DELAYED_RELEASE_CAPSULE | Freq: Every day | ORAL | 3 refills | Status: DC

## 2018-11-11 MED ORDER — ZOLPIDEM TARTRATE 10 MG PO TABS: 10.0000 mg | ORAL_TABLET | Freq: Every evening | ORAL | 0 refills | Status: DC | PRN

## 2018-11-11 NOTE — Patient Instructions (Signed)

## 2018-11-11 NOTE — Progress Notes (Signed)
Subjective:    Patient ID: Mason Rangel, male    DOB: [birth year], 52 y.o.   MRN: [id number]  HPI Pt is a 52 yo male with HTN, hypothyroidism, GERD, anxiety, depression who presents to the clinic for refills and CPE.   Pt is doing really good. He has a new partner and very happy. He continues to have some intermittent CP. He had stress echo which was perfect. Likely stress related when he has CP. He has not had any CP since lost his job. Starts a new job in January.   .. Active Ambulatory Problems    Diagnosis Date Noted  . Left sided chest pain 04/06/2016  . Anxiety 04/06/2016  . Insomnia 04/06/2016  . Adjustment disorder with mixed anxiety and depressed mood 04/06/2016  . Essential hypertension, benign 04/06/2016  . GERD (gastroesophageal reflux disease) 04/06/2016  . Esophageal stenosis 04/06/2016  . Hypogonadism in male 10/19/2016  . LVH (left ventricular hypertrophy) due to hypertensive disease, without heart failure 03/24/2017  . Diastolic dysfunction 04/28/2017  . Plantar fasciitis 12/28/2017  . SVT (supraventricular tachycardia) (HCC) 07/18/2018  . Muscle spasm of back 07/18/2018  . Dizziness 08/09/2018  . Fever 08/09/2018  . 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 Low back pain 08/09/2018  . 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 Syncope 08/09/2018  . Thrombosed external hemorrhoid 11/20/2014  . Screening for cholesterol level 08/12/2018  . Palpitations 08/12/2018   Resolved Ambulatory Problems    Diagnosis Date Noted  . Swelling of foot joint 07/10/2016  . Right foot pain 07/10/2016  . Hypertension, uncontrolled 11/22/2017  . Epididymitis 08/09/2018   Past Medical History:  Diagnosis Date  . Gout   . Hypertension       Review of Systems See HPI.     Objective:   Physical Exam BP (!) 144/87   Pulse 87   Ht 6' (1.829 m)   Wt 220 lb (99.8 kg)   BMI 29.84 kg/m   General Appearance:    Alert,  cooperative, no distress, appears stated age  Head:    Normocephalic, without obvious abnormality, atraumatic  Eyes:    PERRL, conjunctiva/corneas clear, EOM's intact, fundi    benign, both eyes       Ears:    Normal TM's and external ear canals, both ears  Nose:   Nares normal, septum midline, mucosa normal, no drainage    or sinus tenderness  Throat:   Lips, mucosa, and tongue normal; teeth and gums normal  Neck:   Supple, symmetrical, trachea midline, no adenopathy;       thyroid:  No enlargement/tenderness/nodules; no carotid   bruit or JVD  Back:     Symmetric, no curvature, ROM normal, no CVA tenderness  Lungs:     Clear to auscultation bilaterally, respirations unlabored  Chest wall:    No tenderness or deformity  Heart:    Regular rate and rhythm, S1 and S2 normal, no murmur, rub   or gallop  Abdomen:     Soft, non-tender, bowel sounds active all four quadrants,    no masses, no organomegaly        Extremities:   Extremities normal, atraumatic, no cyanosis or edema  Pulses:   2+ and symmetric all extremities  Skin:   Skin color, texture, turgor normal, no rashes or lesions  Lymph nodes:   Cervical, supraclavicular, and axillary nodes normal  Neurologic:   CNII-XII intact. Normal strength,  sensation and reflexes      throughout          Assessment & Plan:  [name] Kitchen.[name] Kitchen.Sharma Covertorman was seen today for medication refill.  Diagnoses and all orders for this visit:  Routine physical examination  Primary insomnia -     zolpidem (AMBIEN) 10 MG tablet; Take 1 tablet (10 mg total) by mouth at bedtime as needed. for sleep -     traZODone (DESYREL) 50 MG tablet; Take 1-2 tablets (50-100 mg total) by mouth at bedtime as needed for sleep.  Essential hypertension, benign -     olmesartan-hydrochlorothiazide (BENICAR HCT) 40-12.5 MG tablet; Take 1 tablet by mouth daily.  Gastroesophageal reflux disease with esophagitis -     esomeprazole (NEXIUM) 40 MG capsule; Take 1 capsule (40 mg total) by  mouth daily.  Non-seasonal allergic rhinitis, unspecified trigger -     fluticasone (FLONASE) 50 MCG/ACT nasal spray; Place 2 sprays into both nostrils daily.  Herpes simplex type 2 infection -     valACYclovir (VALTREX) 1000 MG tablet; Take 1 tablet (1,000 mg total) by mouth daily.   .. [facility] screen [facility] 2/9 11/13/2018 11/22/2017 03/24/2017  Decreased Interest 0 1 2  Down, Depressed, Hopeless 0 1 2  PHQ - 2 Score 0 2 4  Altered sleeping 2 0 0  Tired, decreased energy 1 1 1   Change in appetite 0 1 1  Feeling bad or failure about yourself  0 0 1  Trouble concentrating 0 0 1  Moving slowly or fidgety/restless 0 1 0  Suicidal thoughts 0 0 0  PHQ-9 Score 3 5 8   Difficult doing work/chores Not difficult at all - -   .[name] Kitchen.Start a regular exercise program and make sure you are eating a healthy diet Try to eat 4 servings of dairy a day or take a calcium supplement (500mg  twice a day). Your vaccines are up to date.  Flu shot given today.  Labs done at cardiology. Looks great.  Medication refilled.

## 2018-11-13 ENCOUNTER — Encounter: Payer: Self-pay | Admitting: Physician Assistant

## 2018-11-13 ENCOUNTER — Telehealth: Payer: Self-pay | Admitting: Physician Assistant

## 2018-11-13 NOTE — Telephone Encounter (Signed)
Pt got flu shot but not added in EMR.

## 2018-11-14 DIAGNOSIS — Z23 Encounter for immunization: Secondary | ICD-10-CM | POA: Diagnosis not present

## 2018-11-14 NOTE — Telephone Encounter (Signed)
Done. I have ordered the flu shot and have documented it in the patient's chart.

## 2018-12-20 ENCOUNTER — Encounter: Payer: BLUE CROSS/BLUE SHIELD | Admitting: Physician Assistant

## 2019-01-04 ENCOUNTER — Telehealth: Payer: Self-pay | Admitting: Physician Assistant

## 2019-01-04 DIAGNOSIS — F5101 Primary insomnia: Secondary | ICD-10-CM

## 2019-01-04 MED ORDER — TRAZODONE HCL 50 MG PO TABS: 50.0000 mg | ORAL_TABLET | Freq: Every evening | ORAL | 3 refills | Status: DC | PRN

## 2019-01-04 NOTE — Telephone Encounter (Signed)
Pt needs refill of trazodone. Sent refill.

## 2019-01-05 ENCOUNTER — Other Ambulatory Visit: Payer: Self-pay | Admitting: *Deleted

## 2019-01-05 DIAGNOSIS — F5101 Primary insomnia: Secondary | ICD-10-CM

## 2019-01-05 MED ORDER — TRAZODONE HCL 50 MG PO TABS: 50.0000 mg | ORAL_TABLET | Freq: Every evening | ORAL | 3 refills | Status: DC | PRN

## 2019-01-26 ENCOUNTER — Telehealth: Payer: Self-pay

## 2019-01-26 ENCOUNTER — Other Ambulatory Visit: Payer: Self-pay

## 2019-01-26 DIAGNOSIS — F5101 Primary insomnia: Secondary | ICD-10-CM

## 2019-01-26 MED ORDER — ZOLPIDEM TARTRATE 10 MG PO TABS: 10.0000 mg | ORAL_TABLET | Freq: Every evening | ORAL | 3 refills | Status: DC | PRN

## 2019-01-26 NOTE — Telephone Encounter (Signed)
Spoke with provider as patient is requesting a refill on his Ambien. Provider approved rx being sent to patient's pharmacy with 3 refills. Rx was printed, signed, and faxed to patients pharmacy. No further questions or concerns at this time.

## 2019-06-12 ENCOUNTER — Telehealth: Payer: Self-pay | Admitting: Physician Assistant

## 2019-06-12 DIAGNOSIS — Z20828 Contact with and (suspected) exposure to other viral communicable diseases: Secondary | ICD-10-CM

## 2019-06-12 DIAGNOSIS — Z20822 Contact with and (suspected) exposure to covid-19: Secondary | ICD-10-CM

## 2019-06-12 NOTE — Telephone Encounter (Signed)
Patient scheduled for testing tomorrow at [facility] at 10:30 am.  Testing protocol reviewed with patient.

## 2019-06-12 NOTE — Telephone Encounter (Signed)
Pt calls in today with direct exposure to his daughter who tested positive for COVID. Needs testing. Currently asymptomatic.

## 2019-06-13 ENCOUNTER — Other Ambulatory Visit: Payer: BLUE CROSS/BLUE SHIELD

## 2019-06-13 DIAGNOSIS — Z20822 Contact with and (suspected) exposure to covid-19: Secondary | ICD-10-CM

## 2019-06-17 LAB — NOVEL CORONAVIRUS, NAA: SARS-CoV-2, NAA: NOT DETECTED

## 2019-06-18 ENCOUNTER — Other Ambulatory Visit: Payer: Self-pay

## 2019-06-18 ENCOUNTER — Encounter (HOSPITAL_BASED_OUTPATIENT_CLINIC_OR_DEPARTMENT_OTHER): Payer: Self-pay

## 2019-06-18 ENCOUNTER — Emergency Department (HOSPITAL_BASED_OUTPATIENT_CLINIC_OR_DEPARTMENT_OTHER): Payer: BLUE CROSS/BLUE SHIELD

## 2019-06-18 ENCOUNTER — Emergency Department (HOSPITAL_BASED_OUTPATIENT_CLINIC_OR_DEPARTMENT_OTHER)
Admission: EM | Admit: 2019-06-18 | Discharge: 2019-06-18 | Disposition: A | Discharge status: Home / Self Care | Payer: BLUE CROSS/BLUE SHIELD | Attending: Emergency Medicine | Admitting: Emergency Medicine

## 2019-06-18 DIAGNOSIS — M79602 Pain in left arm: Secondary | ICD-10-CM | POA: Insufficient documentation

## 2019-06-18 DIAGNOSIS — Y92018 Other place in single-family (private) house as the place of occurrence of the external cause: Secondary | ICD-10-CM | POA: Diagnosis not present

## 2019-06-18 DIAGNOSIS — I10 Essential (primary) hypertension: Secondary | ICD-10-CM | POA: Insufficient documentation

## 2019-06-18 DIAGNOSIS — X500XXA Overexertion from strenuous movement or load, initial encounter: Secondary | ICD-10-CM | POA: Insufficient documentation

## 2019-06-18 DIAGNOSIS — Y999 Unspecified external cause status: Secondary | ICD-10-CM | POA: Diagnosis not present

## 2019-06-18 DIAGNOSIS — Y9389 Activity, other specified: Secondary | ICD-10-CM | POA: Insufficient documentation

## 2019-06-18 DIAGNOSIS — Z79899 Other long term (current) drug therapy: Secondary | ICD-10-CM | POA: Diagnosis not present

## 2019-06-18 MED ORDER — IBUPROFEN 800 MG PO TABS
800.0000 mg | ORAL_TABLET | Freq: Once | ORAL | Status: AC
  Administered 2019-06-18: 800 mg via ORAL
  Filled 2019-06-18: qty 1

## 2019-06-18 NOTE — ED Provider Notes (Signed)
[facility] EMERGENCY DEPARTMENT Provider Note   CSN: [id number] Arrival date & time: 06/18/19  1647    History   Chief Complaint Chief Complaint  Patient presents with  . Arm Injury    HPI Mason Rangel is a 53 y.o. male.     The history is provided by the patient.  Arm Injury Location:  Elbow Elbow location:  L elbow Injury: yes   Pain details:    Quality:  Aching   Radiates to:  Does not radiate   Severity:  Mild   Onset quality:  Gradual   Timing:  Constant Handedness:  Left-handed Relieved by:  Nothing Worsened by:  Nothing Associated symptoms: decreased range of motion   Associated symptoms: no back pain, no fever, no muscle weakness, no neck pain, no stiffness and no swelling     Past Medical History:  Diagnosis Date  . Anxiety   . Gout   . Hypertension     Patient Active Problem List   Diagnosis Date Noted  . Screening for cholesterol level 08/12/2018  . Palpitations 08/12/2018  . Dizziness 08/09/2018  . Fever 08/09/2018  . 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 Low back pain 08/09/2018  . 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 Syncope 08/09/2018  . SVT (supraventricular tachycardia) ([place]) 07/18/2018  . Muscle spasm of back 07/18/2018  . Plantar fasciitis 12/28/2017  . Diastolic dysfunction 55/73/2202  . LVH (left ventricular hypertrophy) due to hypertensive disease, without heart failure 03/24/2017  . Hypogonadism in male 10/19/2016  . Left sided chest pain 04/06/2016  . Anxiety 04/06/2016  . Insomnia 04/06/2016  . Adjustment disorder with mixed anxiety and depressed mood 04/06/2016  . Essential hypertension, benign 04/06/2016  . GERD (gastroesophageal reflux disease) 04/06/2016  . Esophageal stenosis 04/06/2016  . Thrombosed external hemorrhoid 11/20/2014    Past Surgical History:  Procedure Laterality Date  . ESOPHAGEAL DILATION    . upper endoscopy with dilation   2009        Home Medications    Prior to Admission medications   Medication Sig Start Date End Date Taking? Authorizing Provider  azelastine (ASTELIN) 0.1 % nasal spray azelastine 137 mcg (0.1 %) nasal spray aerosol    [provider]  BIOTIN PO Take by mouth.    [provider]  clonazePAM (KLONOPIN) 0.5 MG tablet Take 1 tablet (0.5 mg total) by mouth 2 (two) times daily. 03/24/17   Gregor Hams, MD  diclofenac sodium (VOLTAREN) 1 % GEL Apply 2 g topically 4 (four) times daily. Rub into affected area of foot 2 to 4 times daily 05/26/18   Trula Slade, DPM  esomeprazole (NEXIUM) 40 MG capsule Take 1 capsule (40 mg total) by mouth daily. 11/11/18   Breeback, Jade L, PA-C  fluticasone (FLONASE) 50 MCG/ACT nasal spray Place 2 sprays into both nostrils daily. 11/11/18   Breeback, Royetta Car, PA-C  olmesartan-hydrochlorothiazide (BENICAR HCT) 40-12.5 MG tablet Take 1 tablet by mouth daily. 11/11/18   Breeback, Jade L, PA-C  testosterone cypionate (DEPOTESTOTERONE CYPIONATE) 100 MG/ML injection Inject into the muscle every 7 (seven) days. For IM use only    [provider]  traZODone (DESYREL) 50 MG tablet Take 1-2 tablets (50-100 mg total) by mouth at bedtime as needed for sleep. 01/05/19   Breeback, Royetta Car, PA-C  valACYclovir (VALTREX) 1000 MG tablet Take 1 tablet (1,000 mg total) by mouth daily. 11/11/18   Breeback,  Jade L, PA-C  zolpidem (AMBIEN) 10 MG tablet Take 1 tablet (10 mg total) by mouth at bedtime as needed. for sleep 01/26/19   Agapito GamesMetheney, Catherine D, MD    Family History Family History  Problem Relation Age of Onset  . Hypertension Mother   . ALS Father   . Stroke Maternal Grandfather   . Heart attack Paternal Grandmother   . Heart attack Paternal Grandfather     Social History Social History   Tobacco Use  . Smoking status: Never Smoker  . Smokeless tobacco: Never Used  Substance Use Topics  . Alcohol use: Yes    Alcohol/week: 2.0 standard  drinks    Types: 2 Glasses of wine per week    Comment: occ  . Drug use: No     Allergies   Morphine, Lorazepam, and Morphine and related   Review of Systems Review of Systems  Constitutional: Negative for chills and fever.  HENT: Negative for ear pain and sore throat.   Eyes: Negative for pain and visual disturbance.  Respiratory: Negative for cough and shortness of breath.   Cardiovascular: Negative for chest pain and palpitations.  Gastrointestinal: Negative for abdominal pain and vomiting.  Genitourinary: Negative for dysuria and hematuria.  Musculoskeletal: Positive for arthralgias. Negative for back pain, neck pain and stiffness.  Skin: Negative for color change and rash.  Neurological: Negative for seizures and syncope.  All other systems reviewed and are negative.    Physical Exam Updated Vital Signs BP (!) 180/112 Comment: pt reports not taking BP med toady  Pulse 98   Temp 98.6 F (37 C) (Oral)   Resp 17   Ht 6' (1.829 m)   Wt 97.5 kg   SpO2 98%   BMI 29.16 kg/m   Physical Exam Vitals signs and nursing note reviewed.  Constitutional:      Appearance: He is well-developed.  HENT:     Head: Normocephalic and atraumatic.     Nose: Nose normal.     Mouth/Throat:     Mouth: Mucous membranes are moist.  Eyes:     Extraocular Movements: Extraocular movements intact.     Conjunctiva/sclera: Conjunctivae normal.     Pupils: Pupils are equal, round, and reactive to light.  Neck:     Musculoskeletal: Neck supple.  Cardiovascular:     Rate and Rhythm: Normal rate and regular rhythm.     Pulses: Normal pulses.     Heart sounds: Normal heart sounds. No murmur.  Pulmonary:     Effort: Pulmonary effort is normal. No respiratory distress.     Breath sounds: Normal breath sounds.  Abdominal:     Palpations: Abdomen is soft.     Tenderness: There is no abdominal tenderness.  Musculoskeletal: Normal range of motion.        General: Tenderness present. No  swelling, deformity or signs of injury.     Comments: Pain with supination and flexion at the elbow on the left  Skin:    General: Skin is warm and dry.     Capillary Refill: Capillary refill takes less than 2 seconds.  Neurological:     General: No focal deficit present.     Mental Status: He is alert.     Comments: 5+ out of 5 strength in left upper extremity, normal sensation      ED Treatments / Results  Labs (all labs ordered are listed, but only abnormal results are displayed) Labs Reviewed - No data to display  EKG  None  Radiology Dg Elbow Complete Left  Result Date: 06/18/2019 CLINICAL DATA:  Pain. EXAM: LEFT ELBOW - COMPLETE 3+ VIEW COMPARISON:  None. FINDINGS: There is no evidence of fracture, dislocation, or joint effusion. There is no evidence of arthropathy or other focal bone abnormality. Soft tissues are unremarkable. IMPRESSION: Negative. Electronically Signed   By: Gerome Samavid  Williams III M.D   On: 06/18/2019 17:32    Procedures Procedures (including critical care time)  Medications Ordered in ED Medications  ibuprofen (ADVIL) tablet 800 mg (800 mg Oral Given 06/18/19 1756)     Initial Impression / Assessment and Plan / ED Course  I have reviewed the triage vital signs and the nursing notes.  Pertinent labs & imaging results that were available during my care of the patient were reviewed by me and considered in my medical decision making (see chart for details).        Vassie Momentorman [name] Demaria is a 53 year old male who presents to the ED with left arm pain.  Patient with unremarkable vitals.  No fever.  Patient with pain after lifting up a table today.  X-ray shows no fractures of the left elbow.  Patient with normal sensation and strength.  Has some pain with supination and flexion at the elbow.  Possibly there is ligamentous injury.  Possibly bicep related or elbow related.  Recommend Tylenol, Motrin, ice.  Will place in a sling.  Overall patient has good strength  and exam.  We will have him follow-up with sports medicine.  Discharged in ED in good condition.  Understands return precautions.  This chart was dictated using voice recognition software.  Despite best efforts to proofread,  errors can occur which can change the documentation meaning.    Final Clinical Impressions(s) / ED Diagnoses   Final diagnoses:  Left arm pain    ED Discharge Orders    None       Virgina NorfolkCuratolo, Floy Riegler, DO 06/18/19 1800

## 2019-06-18 NOTE — ED Notes (Signed)
Pt discharged by Shelda Pal, RN

## 2019-06-18 NOTE — ED Triage Notes (Signed)
Pt presents after moving furniture and feeling popped elbow- ice applied. No deformity noted. Pt able to perform ROM.

## 2019-06-19 ENCOUNTER — Telehealth: Payer: Self-pay | Admitting: Physician Assistant

## 2019-06-19 MED ORDER — MELOXICAM 15 MG PO TABS: 15.0000 mg | ORAL_TABLET | Freq: Every day | ORAL | 1 refills | Status: DC

## 2019-06-19 NOTE — Telephone Encounter (Signed)
Pt was seen in ED for left elbow and upper arm pain. Wanted to know my opinion. Likely distal bicep strain. mobic given. Ice. Rest for 1-2 days then start exercises. Follow up with sports medicine if symptoms not improving or strength not improving.

## 2019-06-22 ENCOUNTER — Other Ambulatory Visit: Payer: Self-pay

## 2019-06-22 ENCOUNTER — Encounter: Payer: Self-pay | Admitting: Family Medicine

## 2019-06-22 ENCOUNTER — Ambulatory Visit: Payer: BLUE CROSS/BLUE SHIELD | Admitting: Family Medicine

## 2019-06-22 ENCOUNTER — Ambulatory Visit: Payer: Self-pay

## 2019-06-22 VITALS — BP 155/116 | HR 77 | Ht 72.0 in | Wt 215.0 lb

## 2019-06-22 DIAGNOSIS — S46212A Strain of muscle, fascia and tendon of other parts of biceps, left arm, initial encounter: Secondary | ICD-10-CM | POA: Diagnosis not present

## 2019-06-22 DIAGNOSIS — M25522 Pain in left elbow: Secondary | ICD-10-CM

## 2019-06-22 NOTE — Assessment & Plan Note (Signed)
Findings suggest complete rupture of distal biceps tendons.  - referral to Dr. Tamera Punt at [facility]  - counseled on supportive care.  - f/u PRN.

## 2019-06-22 NOTE — Patient Instructions (Signed)
Nice to meet you You should get a call to schedule an appointment with orthopedics, Dr. Tamera Punt at [facility]  Please send me a message in [place] with any questions or updates.  Please see me back if needed.   --Dr. Raeford Razor

## 2019-06-22 NOTE — Progress Notes (Signed)
Mason Rangel - 53 y.o. male MRN [id number]  Date of birth: Apr 16, 1966  SUBJECTIVE:  Including CC & ROS.  Chief Complaint  Patient presents with  . Arm Injury    left arm x 06/18/2019    Mason Rangel is a 53 y.o. male that is presenting with left arm pain.  He was lifting something recently and he felt a pop at the level of the elbow.  He had significant bruising and swelling.  He has significant weakness upon trying to lift anything and he feels like his arm is gives way at times.  No history of prior injury or surgeries to the area.  He was seen in the emergency department on 7/12 and placed in a sling.  He does not have any pain at this time.  He Bangert has ongoing ecchymosis and swelling.  Symptoms seem to be localized to the elbow.  Independent review of the left elbow x-ray from 7/12 shows no acute abnormality.   Review of Systems  Constitutional: Negative for fever.  HENT: Negative for congestion.   Respiratory: Negative for cough.   Cardiovascular: Negative for chest pain.  Gastrointestinal: Negative for abdominal pain.  Musculoskeletal: Negative for joint swelling.  Skin: Positive for color change.  Neurological: Positive for weakness.  Hematological: Negative for adenopathy.    HISTORY: Past Medical, Surgical, Social, and Family History Reviewed & Updated per EMR.   Pertinent Historical Findings include:  Past Medical History:  Diagnosis Date  . Anxiety   . Gout   . Hypertension     Past Surgical History:  Procedure Laterality Date  . ESOPHAGEAL DILATION    . upper endoscopy with dilation  2009    Allergies  Allergen Reactions  . Morphine Anaphylaxis, Nausea Only and Other (See Comments)    Dizziness    . Lorazepam Other (See Comments)    Severe nightmares  . Morphine And Related     Family History  Problem Relation Age of Onset  . Hypertension Mother   . ALS Father   . Stroke Maternal Grandfather   . Heart attack Paternal Grandmother   .  Heart attack Paternal Grandfather      Social History   Socioeconomic History  . Marital status: Divorced    Spouse name: Not on file  . Number of children: Not on file  . Years of education: Not on file  . Highest education level: Not on file  Occupational History  . Not on file  Social Needs  . Financial resource strain: Not on file  . Food insecurity    Worry: Not on file    Inability: Not on file  . Transportation needs    Medical: Not on file    Non-medical: Not on file  Tobacco Use  . Smoking status: Never Smoker  . Smokeless tobacco: Never Used  Substance and Sexual Activity  . Alcohol use: Yes    Alcohol/week: 2.0 standard drinks    Types: 2 Glasses of wine per week    Comment: occ  . Drug use: No  . Sexual activity: Not on file  Lifestyle  . Physical activity    Days per week: Not on file    Minutes per session: Not on file  . Stress: Not on file  Relationships  . Social Musicianconnections    Talks on phone: Not on file    Gets together: Not on file    Attends religious service: Not on file    Active member  of club or organization: Not on file    Attends meetings of clubs or organizations: Not on file    Relationship status: Not on file  . Intimate partner violence    Fear of current or ex partner: Not on file    Emotionally abused: Not on file    Physically abused: Not on file    Forced sexual activity: Not on file  Other Topics Concern  . Not on file  Social History Narrative  . Not on file     PHYSICAL EXAM:  VS: BP (!) 155/116   Pulse 77   Ht 6' (1.829 m)   Wt 215 lb (97.5 kg)   BMI 29.16 kg/m  Physical Exam Gen: NAD, alert, cooperative with exam, well-appearing ENT: normal lips, normal nasal mucosa,  Eye: normal EOM, normal conjunctiva and lids CV:  no edema, +2 pedal pulses   Resp: no accessory muscle use, non-labored,  Skin: no rashes, no areas of induration  Neuro: normal tone, normal sensation to touch Psych:  normal insight, alert  and oriented MSK:  Left arm: Ecchymosis and swelling at the antecubital fossa and medial aspect of forearm. Normal supination and pronation. Unable to appreciate a hook the distal biceps tendon. Weakness with elbow flexion. Normal strength resistance with elbow extension. Neurovascularly intact  Limited ultrasound: Left elbow:  There appears to be a full rupture with retraction of the distal biceps tendon  Summary: Distal biceps tendon rupture  Ultrasound and interpretation by Clearance Coots, MD      ASSESSMENT & PLAN:   Rupture of left distal biceps tendon Findings suggest complete rupture of distal biceps tendons.  - referral to Dr. Tamera Punt at [facility]  - counseled on supportive care.  - f/u PRN.

## 2019-08-04 ENCOUNTER — Telehealth: Payer: Self-pay | Admitting: Physician Assistant

## 2019-08-04 DIAGNOSIS — K2100 Gastro-esophageal reflux disease with esophagitis, without bleeding: Secondary | ICD-10-CM

## 2019-08-04 DIAGNOSIS — I10 Essential (primary) hypertension: Secondary | ICD-10-CM

## 2019-08-04 MED ORDER — OLMESARTAN MEDOXOMIL-HCTZ 40-12.5 MG PO TABS: 1.0000 | ORAL_TABLET | Freq: Every day | ORAL | 2 refills | Status: DC

## 2019-08-04 MED ORDER — ESOMEPRAZOLE MAGNESIUM 40 MG PO CPDR: 40.0000 mg | DELAYED_RELEASE_CAPSULE | Freq: Every day | ORAL | 3 refills | Status: DC

## 2019-08-04 NOTE — Telephone Encounter (Signed)
Pt wanted to switch pharmacy.

## 2019-08-18 ENCOUNTER — Other Ambulatory Visit: Payer: Self-pay | Admitting: Family Medicine

## 2019-08-18 DIAGNOSIS — F5101 Primary insomnia: Secondary | ICD-10-CM

## 2019-08-21 ENCOUNTER — Other Ambulatory Visit: Payer: Self-pay | Admitting: Physician Assistant

## 2019-08-21 DIAGNOSIS — F5101 Primary insomnia: Secondary | ICD-10-CM

## 2019-08-21 MED ORDER — ZOLPIDEM TARTRATE 10 MG PO TABS: 10.0000 mg | ORAL_TABLET | Freq: Every evening | ORAL | 0 refills | Status: DC | PRN

## 2019-09-20 ENCOUNTER — Other Ambulatory Visit: Payer: Self-pay | Admitting: Physician Assistant

## 2019-09-20 DIAGNOSIS — F5101 Primary insomnia: Secondary | ICD-10-CM

## 2019-09-21 NOTE — Telephone Encounter (Signed)
Last visit 11/11/2018. Last filled 08/21/2019 #30 with no refills.

## 2019-09-22 NOTE — Telephone Encounter (Signed)
NEEDS appt. Where does he want this sent too. I will get 15 more.

## 2019-09-22 NOTE — Telephone Encounter (Signed)
Please schedule patient for follow up, then can send #[phone]. Thanks.

## 2019-09-25 NOTE — Telephone Encounter (Signed)
Please send

## 2019-09-25 NOTE — Telephone Encounter (Signed)
Appointment has been made

## 2019-09-27 ENCOUNTER — Ambulatory Visit (INDEPENDENT_AMBULATORY_CARE_PROVIDER_SITE_OTHER): Payer: BLUE CROSS/BLUE SHIELD | Admitting: Physician Assistant

## 2019-09-27 ENCOUNTER — Other Ambulatory Visit: Payer: Self-pay

## 2019-09-27 VITALS — BP 170/127 | HR 84 | Ht 72.0 in | Wt 227.0 lb

## 2019-09-27 DIAGNOSIS — K2100 Gastro-esophageal reflux disease with esophagitis, without bleeding: Secondary | ICD-10-CM | POA: Diagnosis not present

## 2019-09-27 DIAGNOSIS — I10 Essential (primary) hypertension: Secondary | ICD-10-CM

## 2019-09-27 DIAGNOSIS — Z131 Encounter for screening for diabetes mellitus: Secondary | ICD-10-CM

## 2019-09-27 DIAGNOSIS — Z13220 Encounter for screening for lipoid disorders: Secondary | ICD-10-CM

## 2019-09-27 DIAGNOSIS — B009 Herpesviral infection, unspecified: Secondary | ICD-10-CM

## 2019-09-27 DIAGNOSIS — I169 Hypertensive crisis, unspecified: Secondary | ICD-10-CM

## 2019-09-27 DIAGNOSIS — Z0000 Encounter for general adult medical examination without abnormal findings: Secondary | ICD-10-CM | POA: Diagnosis not present

## 2019-09-27 DIAGNOSIS — F5101 Primary insomnia: Secondary | ICD-10-CM | POA: Diagnosis not present

## 2019-09-27 MED ORDER — OLMESARTAN MEDOXOMIL-HCTZ 40-12.5 MG PO TABS: 1.0000 | ORAL_TABLET | Freq: Every day | ORAL | 2 refills | Status: DC

## 2019-09-27 MED ORDER — VALACYCLOVIR HCL 1 G PO TABS: 1000.0000 mg | ORAL_TABLET | Freq: Every day | ORAL | 3 refills | Status: DC

## 2019-09-27 MED ORDER — TRAZODONE HCL 50 MG PO TABS: 50.0000 mg | ORAL_TABLET | Freq: Every evening | ORAL | 3 refills | Status: DC | PRN

## 2019-09-27 MED ORDER — ZOLPIDEM TARTRATE 10 MG PO TABS: 10.0000 mg | ORAL_TABLET | Freq: Every evening | ORAL | 5 refills | Status: DC | PRN

## 2019-09-27 MED ORDER — ESOMEPRAZOLE MAGNESIUM 40 MG PO CPDR: 40.0000 mg | DELAYED_RELEASE_CAPSULE | Freq: Every day | ORAL | 3 refills | Status: DC

## 2019-09-28 ENCOUNTER — Telehealth: Payer: Self-pay | Admitting: Physician Assistant

## 2019-09-28 ENCOUNTER — Encounter: Payer: Self-pay | Admitting: Physician Assistant

## 2019-09-28 DIAGNOSIS — I169 Hypertensive crisis, unspecified: Secondary | ICD-10-CM | POA: Insufficient documentation

## 2019-09-28 DIAGNOSIS — K2100 Gastro-esophageal reflux disease with esophagitis, without bleeding: Secondary | ICD-10-CM | POA: Insufficient documentation

## 2019-09-28 NOTE — Progress Notes (Signed)
Subjective:    Patient ID: Mason Rangel Mason Rangel, male    DOB: 04-Dec-1966, 53 y.o.   MRN: [id number]  HPI Pt is a 53 yo male who presents to the clinic for yearly exam and med refills.   He has no concerns today. He denies CP, palpitations, headaches, or vision changes.   He admits he stopped on his own taking blood pressure medication when he lost his insurance and never re-started.   .. Active Ambulatory Problems    Diagnosis Date Noted  . Left sided chest pain 04/06/2016  . Anxiety 04/06/2016  . Insomnia 04/06/2016  . Adjustment disorder with mixed anxiety and depressed mood 04/06/2016  . Essential hypertension, benign 04/06/2016  . GERD (gastroesophageal reflux disease) 04/06/2016  . Esophageal stenosis 04/06/2016  . Hypogonadism in male 10/19/2016  . LVH (left ventricular hypertrophy) due to hypertensive disease, without heart failure 03/24/2017  . Diastolic dysfunction 04/28/2017  . Plantar fasciitis 12/28/2017  . SVT (supraventricular tachycardia) (HCC) 07/18/2018  . Muscle spasm of back 07/18/2018  . Dizziness 08/09/2018  . Fever 08/09/2018  . 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 Low back pain 08/09/2018  . 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 Syncope 08/09/2018  . Thrombosed external hemorrhoid 11/20/2014  . Screening for cholesterol level 08/12/2018  . Palpitations 08/12/2018  . Rupture of left distal biceps tendon 06/22/2019  . Gastroesophageal reflux disease with esophagitis 09/28/2019   Resolved Ambulatory Problems    Diagnosis Date Noted  . Swelling of foot joint 07/10/2016  . Right foot pain 07/10/2016  . Hypertension, uncontrolled 11/22/2017  . Epididymitis 08/09/2018   Past Medical History:  Diagnosis Date  . Gout   . Hypertension    .[name] Kitchen. Family History  Problem Relation Age of Onset  . Hypertension Mother   . ALS Father   . Stroke Maternal Grandfather   . Heart attack Paternal Grandmother    . Heart attack Paternal Grandfather    .[name] Kitchen. Social History   Socioeconomic History  . Marital status: Divorced    Spouse name: Not on file  . Number of children: Not on file  . Years of education: Not on file  . Highest education level: Not on file  Occupational History  . Not on file  Social Needs  . Financial resource strain: Not on file  . Food insecurity    Worry: Not on file    Inability: Not on file  . Transportation needs    Medical: Not on file    Non-medical: Not on file  Tobacco Use  . Smoking status: Never Smoker  . Smokeless tobacco: Never Used  Substance and Sexual Activity  . Alcohol use: Yes    Alcohol/week: 2.0 standard drinks    Types: 2 Glasses of wine per week    Comment: occ  . Drug use: No  . Sexual activity: Not on file  Lifestyle  . Physical activity    Days per week: Not on file    Minutes per session: Not on file  . Stress: Not on file  Relationships  . Social Musicianconnections    Talks on phone: Not on file    Gets together: Not on file    Attends religious service: Not on file    Active member of club or organization: Not on file    Attends meetings of clubs or organizations: Not on file    Relationship status: Not on file  . Intimate partner  violence    Fear of current or ex partner: Not on file    Emotionally abused: Not on file    Physically abused: Not on file    Forced sexual activity: Not on file  Other Topics Concern  . Not on file  Social History Narrative  . Not on file      Review of Systems  All other systems reviewed and are negative.      Objective:   Physical Exam BP (!) 170/127   Pulse 84   Ht 6' (1.829 m)   Wt 227 lb (103 kg)   SpO2 95%   BMI 30.79 kg/m   General Appearance:    Alert, cooperative, no distress, appears stated age  Head:    Normocephalic, without obvious abnormality, atraumatic  Eyes:    PERRL, conjunctiva/corneas clear, EOM's intact, fundi    benign, both eyes       Ears:    Normal TM's  and external ear canals, both ears  Nose:   Nares normal, septum midline, mucosa normal, no drainage    or sinus tenderness  Throat:   Lips, mucosa, and tongue normal; teeth and gums normal  Neck:   Supple, symmetrical, trachea midline, no adenopathy;       thyroid:  No enlargement/tenderness/nodules; no carotid   bruit or JVD  Back:     Symmetric, no curvature, ROM normal, no CVA tenderness  Lungs:     Clear to auscultation bilaterally, respirations unlabored  Chest wall:    No tenderness or deformity  Heart:    Regular rate and rhythm, S1 and S2 normal, no murmur, rub   or gallop  Abdomen:     Soft, non-tender, bowel sounds active all four quadrants,    no masses, no organomegaly         Extremities:   Extremities normal, atraumatic, no cyanosis or edema  Pulses:   2+ and symmetric all extremities  Skin:   Skin color, texture, turgor normal, no rashes or lesions  Lymph nodes:   Cervical, supraclavicular, and axillary nodes normal  Neurologic:   CNII-XII intact. Normal strength, sensation and reflexes      throughout     .[name] Kitchen Depression screen [facility] 2/9 09/27/2019 11/13/2018 11/22/2017 03/24/2017  Decreased Interest 0 0 1 2  Down, Depressed, Hopeless 0 0 1 2  PHQ - 2 Score 0 0 2 4  Altered sleeping 1 2 0 0  Tired, decreased energy 0 1 1 1   Change in appetite 0 0 1 1  Feeling bad or failure about yourself  0 0 0 1  Trouble concentrating 1 0 0 1  Moving slowly or fidgety/restless 1 0 1 0  Suicidal thoughts 0 0 0 0  PHQ-9 Score 3 3 5 8   Difficult doing work/chores Not difficult at all Not difficult at all - -   . IPSS Questionnaire (AUA-7): Over the past month.   1)  How often have you had a sensation of not emptying your bladder completely after you finish urinating?  0 - Not at all  2)  How often have you had to urinate again less than two hours after you finished urinating? 0 - Not at all  3)  How often have you found you stopped and started again several times when you  urinated?  0 - Not at all  4) How difficult have you found it to postpone urination?  0 - Not at all  5) How often have you had a  weak urinary stream?  0 - Not at all  6) How often have you had to push or strain to begin urination?  0 - Not at all  7) How many times did you most typically get up to urinate from the time you went to bed until the time you got up in the morning?  1 - 1 time  Total score:  0-7 mildly symptomatic   8-19 moderately symptomatic   20-35 severely symptomatic       Assessment & Plan:  [name] KitchenMarland KitchenBaylor was seen today for insomnia and hypertension.  Diagnoses and all orders for this visit:  Routine physical examination -     CBC -     Lipid Panel w/reflex Direct LDL -     COMPLETE METABOLIC PANEL WITH GFR -     PSA  Essential hypertension, benign -     olmesartan-hydrochlorothiazide (BENICAR HCT) 40-12.5 MG tablet; Take 1 tablet by mouth daily.  Primary insomnia -     zolpidem (AMBIEN) 10 MG tablet; Take 1 tablet (10 mg total) by mouth at bedtime as needed. for sleep -     traZODone (DESYREL) 50 MG tablet; Take 1-2 tablets (50-100 mg total) by mouth at bedtime as needed for sleep.  Gastroesophageal reflux disease with esophagitis -     esomeprazole (NEXIUM) 40 MG capsule; Take 1 capsule (40 mg total) by mouth daily.  Herpes simplex type 2 infection -     valACYclovir (VALTREX) 1000 MG tablet; Take 1 tablet (1,000 mg total) by mouth daily.  Screening for diabetes mellitus -     COMPLETE METABOLIC PANEL WITH GFR  Screening for lipid disorders -     Lipid Panel w/reflex Direct LDL   .[name] KitchenStart a regular exercise program and make sure you are eating a healthy diet Try to eat 4 servings of dairy a day or take a calcium supplement (500mg  twice a day). Pt reports getting flu shot elsewhere. Discussed shingles. Pt declined today.  Per patient colonoscopy UTD. PSA screening ordered. AUA low score.  Fasting labs ordered.  BP terrible. Pt has not been on medication.  START medication NoW. Start checking BP and report log and nurse visit in 2 weeks.

## 2019-09-28 NOTE — Telephone Encounter (Signed)
Labs reordered for [facility]. Holding to wait to hear from patient.

## 2019-09-28 NOTE — Addendum Note (Signed)
Addended byAnnamaria Helling on: 09/28/2019 09:12 AM   Modules accepted: Orders

## 2019-11-24 ENCOUNTER — Other Ambulatory Visit: Payer: BLUE CROSS/BLUE SHIELD

## 2020-03-15 ENCOUNTER — Ambulatory Visit (INDEPENDENT_AMBULATORY_CARE_PROVIDER_SITE_OTHER): Payer: 59 | Admitting: Cardiology

## 2020-03-15 ENCOUNTER — Other Ambulatory Visit: Payer: Self-pay

## 2020-03-15 ENCOUNTER — Encounter: Payer: Self-pay | Admitting: Cardiology

## 2020-03-15 VITALS — BP 160/98 | HR 80 | Ht 72.0 in | Wt 231.0 lb

## 2020-03-15 DIAGNOSIS — N289 Disorder of kidney and ureter, unspecified: Secondary | ICD-10-CM

## 2020-03-15 DIAGNOSIS — I10 Essential (primary) hypertension: Secondary | ICD-10-CM

## 2020-03-15 DIAGNOSIS — I471 Supraventricular tachycardia: Secondary | ICD-10-CM

## 2020-03-15 MED ORDER — AMLODIPINE BESYLATE 5 MG PO TABS: 5.0000 mg | ORAL_TABLET | Freq: Every day | ORAL | 3 refills | Status: DC

## 2020-03-15 NOTE — Patient Instructions (Signed)
Medication Instructions:  Your physician has recommended you make the following change in your medication:   Start Amlodipine 5 mg daily.  *If you need a refill on your cardiac medications before your next appointment, please call your pharmacy*   Lab Work: Your physician recommends that you return for lab work in: next few days. You need to have labs done when you are fasting.  You can come Monday through Friday 8:30 am to 12:00 pm and 1:15 to 4:30. You do not need to make an appointment as the order has already been placed. The labs you are going to have done are BMET, CBC, TSH, LFT and Lipids.   If you have labs (blood work) drawn today and your tests are completely normal, you will receive your results only by: [name] Kitchen MyChart Message (if you have MyChart) OR . A paper copy in the mail If you have any lab test that is abnormal or we need to change your treatment, we will call you to review the results.   Testing/Procedures: None ordered   Follow-Up: At [facility], you and your health needs are our priority.  As part of our continuing mission to provide you with exceptional heart care, we have created designated Provider Care Teams.  These Care Teams include your primary Cardiologist (physician) and Advanced Practice Providers (APPs -  Physician Assistants and Nurse Practitioners) who all work together to provide you with the care you need, when you need it.  We recommend signing up for the patient portal called "MyChart".  Sign up information is provided on this After Visit Summary.  MyChart is used to connect with patients for Virtual Visits (Telemedicine).  Patients are able to view lab/test results, encounter notes, upcoming appointments, etc.  Non-urgent messages can be sent to your provider as well.   To learn more about what you can do with MyChart, go to [email or web address].    Your next appointment:   6 month(s)  The format for your next appointment:   In  Person  Provider:   Belva Crome, MD   Other Instructions Amlodipine Oral Tablets What is this medicine? AMLODIPINE (am LOE di peen) is a calcium channel blocker. It relaxes your blood vessels and decreases the amount of work the heart has to do. It treats high blood pressure and/or prevents chest pain (also called angina). This medicine may be used for other purposes; ask your health care provider or pharmacist if you have questions. COMMON BRAND NAME(S): Norvasc What should I tell my health care provider before I take this medicine? They need to know if you have any of these conditions:  heart disease  liver disease  an unusual or allergic reaction to amlodipine, other drugs, foods, dyes, or preservatives  pregnant or trying to get pregnant  breast-feeding How should I use this medicine? Take this drug by mouth. Take it as directed on the prescription label at the same time every day. You can take it with or without food. If it upsets your stomach, take it with food. Keep taking it unless your health care provider tells you to stop. Talk to your health care provider about the use of this drug in children. While it may be prescribed for children as young as 6 for selected conditions, precautions do apply. Overdosage: If you think you have taken too much of this medicine contact a poison control center or emergency room at once. NOTE: This medicine is only for you. Do not share this medicine with others.  What if I miss a dose? If you miss a dose, take it as soon as you can. If it is almost time for your next dose, take only that dose. Do not take double or extra doses. What may interact with this medicine? This medicine may interact with the following medications:  clarithromycin  cyclosporine  diltiazem  itraconazole  simvastatin  tacrolimus This list may not describe all possible interactions. Give your health care provider a list of all the medicines, herbs,  non-prescription drugs, or dietary supplements you use. Also tell them if you smoke, drink alcohol, or use illegal drugs. Some items may interact with your medicine. What should I watch for while using this medicine? Visit your health care provider for regular checks on your progress. Check your blood pressure as directed. Ask your health care provider what your blood pressure should be. Also, find out when you should contact him or her. Do not treat yourself for coughs, colds, or pain while you are using this drug without asking your health care provider for advice. Some drugs may increase your blood pressure. You may get drowsy or dizzy. Do not drive, use machinery, or do anything that needs mental alertness until you know how this drug affects you. Do not stand up or sit up quickly, especially if you are an older patient. This reduces the risk of dizzy or fainting spells. What side effects may I notice from receiving this medicine? Side effects that you should report to your doctor or health care provider as soon as possible:  allergic reactions (skin rash, itching or hives; swelling of the face, lips, or tongue)  heart attack (trouble breathing; pain or tightness in the chest, neck, back or arms; unusually weak or tired)  low blood pressure (dizziness; feeling faint or lightheaded, falls; unusually weak or tired) Side effects that usually do not require medical attention (report these to your doctor or health care provider if they continue or are bothersome):  facial flushing  nausea  palpitations  stomach pain  sudden weight gain  swelling of the ankles, feet, hands This list may not describe all possible side effects. Call your doctor for medical advice about side effects. You may report side effects to FDA at 1-800-FDA-1088. Where should I keep my medicine? Keep out of the reach of children and pets. Store at room temperature between 59 and 86 degrees F (15 and 30 degrees C).  Protect from light and moisture. Keep the container tightly closed. Throw away any unused drug after the expiration date. NOTE: This sheet is a summary. It may not cover all possible information. If you have questions about this medicine, talk to your doctor, pharmacist, or health care provider.  2020 Elsevier/Gold Standard (2019-08-29 19:39:45)

## 2020-03-15 NOTE — Progress Notes (Signed)
Cardiology Office Note:    Date:  03/15/2020   ID:  Mason Rangel, DOB January 29, 1966, MRN [id number]  PCP:  Jomarie Longs, PA-C  Cardiologist:  Garwin Brothers, MD   Referring MD: Jomarie Longs, PA-C    ASSESSMENT:    1. Essential hypertension, benign   2. SVT (supraventricular tachycardia) (HCC)    PLAN:    In order of problems listed above:  1. Primary prevention stressed with the patient.  Importance of compliance with diet medication stressed and he vocalized understanding. 2. Essential hypertension: Blood pressure is elevated and he has similar readings at home so we will add amlodipine 5 mg daily.  Benefits and potential is explained.  He will be back in the next few days for blood work including fasting lipids 3.         History of supraventricular tachycardia: Asymptomatic at this time he has not had any issues.Patient will be seen in follow-up appointment in 6 months or earlier if the patient has any concerns    Medication Adjustments/Labs and Tests Ordered: Current medicines are reviewed at length with the patient today.  Concerns regarding medicines are outlined above.  No orders of the defined types were placed in this encounter.  No orders of the defined types were placed in this encounter.    No chief complaint on file.    History of Present Illness:    Mason Rangel is a 54 y.o. male.  Patient has past medical history of essential hypertension and supraventricular tachycardia.  He denies any problems at this time and takes care of it.  Days of daily living.  No chest pain orthopnea or PND.  Has not been seen here for a long time.  At the time of my evaluation, the patient is alert awake oriented and in no distress.  Past Medical History:  Diagnosis Date  . Anxiety   . Gout   . Hypertension     Past Surgical History:  Procedure Laterality Date  . ESOPHAGEAL DILATION    . upper endoscopy with dilation  2009    Current  Medications: Current Meds  Medication Sig  . esomeprazole (NEXIUM) 40 MG capsule Take 1 capsule (40 mg total) by mouth daily.  [name] Kitchen olmesartan-hydrochlorothiazide (BENICAR HCT) 40-12.5 MG tablet Take 1 tablet by mouth daily.  . Tadalafil 2.5 MG TABS TAKE 1 TABLET BY MOUTH ONCE DAILY FOR 90 DAYS  . testosterone cypionate (DEPOTESTOTERONE CYPIONATE) 100 MG/ML injection Inject into the muscle every 7 (seven) days. For IM use only  . traZODone (DESYREL) 50 MG tablet Take 1-2 tablets (50-100 mg total) by mouth at bedtime as needed for sleep.  [name] Kitchen zolpidem (AMBIEN) 10 MG tablet Take 1 tablet (10 mg total) by mouth at bedtime as needed. for sleep     Allergies:   Morphine, Lorazepam, and Morphine and related   Social History   Socioeconomic History  . Marital status: Divorced    Spouse name: Not on file  . Number of children: Not on file  . Years of education: Not on file  . Highest education level: Not on file  Occupational History  . Not on file  Tobacco Use  . Smoking status: Never Smoker  . Smokeless tobacco: Never Used  Substance and Sexual Activity  . Alcohol use: Yes    Alcohol/week: 2.0 standard drinks    Types: 2 Glasses of wine per week    Comment: occ  . Drug use: No  . Sexual  activity: Not on file  Other Topics Concern  . Not on file  Social History Narrative  . Not on file   Social Determinants of Health   Financial Resource Strain:   . Difficulty of Paying Living Expenses:   Food Insecurity:   . Worried About Charity fundraiser in the Last Year:   . Arboriculturist in the Last Year:   Transportation Needs:   . Film/video editor (Medical):   [name] Kitchen Lack of Transportation (Non-Medical):   Physical Activity:   . Days of Exercise per Week:   . Minutes of Exercise per Session:   Stress:   . Feeling of Stress :   Social Connections:   . Frequency of Communication with Friends and Family:   . Frequency of Social Gatherings with Friends and Family:   . Attends  Religious Services:   . Active Member of Clubs or Organizations:   . Attends Archivist Meetings:   [name] Kitchen Marital Status:      Family History: The patient's family history includes ALS in his father; Heart attack in his paternal grandfather and paternal grandmother; Hypertension in his mother; Stroke in his maternal grandfather.  ROS:   Please see the history of present illness.    All other systems reviewed and are negative.  EKGs/Labs/Other Studies Reviewed:    The following studies were reviewed today: I discussed my findings with the patient at length.   Recent Labs: No results found for requested labs within last 8760 hours.  Recent Lipid Panel    Component Value Date/Time   CHOL 167 08/15/2018 0850   TRIG 112 08/15/2018 0850   HDL 37 (L) 08/15/2018 0850   CHOLHDL 4.5 08/15/2018 0850   CHOLHDL 4.7 11/22/2017 1105   VLDL 14 10/19/2016 0850   LDLCALC 108 (H) 08/15/2018 0850   LDLCALC 109 (H) 11/22/2017 1105    Physical Exam:    VS:  BP (!) 160/98   Pulse 80   Ht 6' (1.829 m)   Wt 231 lb (104.8 kg)   SpO2 96%   BMI 31.33 kg/m     Wt Readings from Last 3 Encounters:  03/15/20 231 lb (104.8 kg)  09/27/19 227 lb (103 kg)  06/22/19 215 lb (97.5 kg)     GEN: Patient is in no acute distress HEENT: Normal NECK: No JVD; No carotid bruits LYMPHATICS: No lymphadenopathy CARDIAC: Hear sounds regular, 2/6 systolic murmur at the apex. RESPIRATORY:  Clear to auscultation without rales, wheezing or rhonchi  ABDOMEN: Soft, non-tender, non-distended MUSCULOSKELETAL:  No edema; No deformity  SKIN: Warm and dry NEUROLOGIC:  Alert and oriented x 3 PSYCHIATRIC:  Normal affect   Signed, Jenean Lindau, MD  03/15/2020 11:05 AM    [place]

## 2020-03-29 ENCOUNTER — Other Ambulatory Visit: Payer: Self-pay | Admitting: Physician Assistant

## 2020-03-29 DIAGNOSIS — F5101 Primary insomnia: Secondary | ICD-10-CM

## 2020-03-29 NOTE — Telephone Encounter (Signed)
Last filled 09/27/2019 #30 with 5 refills Last appt 09/26/2020, pended #30 with note that patient needs appt for further refills

## 2020-03-30 LAB — LIPID PANEL
Chol/HDL Ratio: 4.9 ratio (ref 0.0–5.0)
Cholesterol, Total: 170 mg/dL (ref 100–199)
HDL: 35 mg/dL — ABNORMAL LOW (ref >39)
LDL Chol Calc (NIH): 107 mg/dL — ABNORMAL HIGH (ref 0–99)
Triglycerides: 156 mg/dL — ABNORMAL HIGH (ref 0–149)
VLDL Cholesterol Cal: 28 mg/dL (ref 5–40)

## 2020-03-30 LAB — CBC WITH DIFFERENTIAL/PLATELET
Basophils Absolute: 0.1 10*3/uL (ref 0.0–0.2)
Basos: 1 %
EOS (ABSOLUTE): 0.1 10*3/uL (ref 0.0–0.4)
Eos: 3 %
Hematocrit: 45 % (ref 37.5–51.0)
Hemoglobin: 15.6 g/dL (ref 13.0–17.7)
Immature Grans (Abs): 0 10*3/uL (ref 0.0–0.1)
Immature Granulocytes: 0 %
Lymphocytes Absolute: 1.7 10*3/uL (ref 0.7–3.1)
Lymphs: 37 %
MCH: 31.5 pg (ref 26.6–33.0)
MCHC: 34.7 g/dL (ref 31.5–35.7)
MCV: 91 fL (ref 79–97)
Monocytes Absolute: 0.5 10*3/uL (ref 0.1–0.9)
Monocytes: 11 %
Neutrophils Absolute: 2.1 10*3/uL (ref 1.4–7.0)
Neutrophils: 48 %
Platelets: 140 10*3/uL — ABNORMAL LOW (ref 150–450)
RBC: 4.95 x10E6/uL (ref 4.14–5.80)
RDW: 13.4 % (ref 11.6–15.4)
WBC: 4.5 10*3/uL (ref 3.4–10.8)

## 2020-03-30 LAB — BASIC METABOLIC PANEL
BUN/Creatinine Ratio: 12 (ref 9–20)
BUN: 17 mg/dL (ref 6–24)
CO2: 24 mmol/L (ref 20–29)
Calcium: 9.2 mg/dL (ref 8.7–10.2)
Chloride: 103 mmol/L (ref 96–106)
Creatinine, Ser: 1.4 mg/dL — ABNORMAL HIGH (ref 0.76–1.27)
GFR calc Af Amer: 65 mL/min/{1.73_m2} (ref >59)
GFR calc non Af Amer: 56 mL/min/{1.73_m2} — ABNORMAL LOW (ref >59)
Glucose: 103 mg/dL — ABNORMAL HIGH (ref 65–99)
Potassium: 4.4 mmol/L (ref 3.5–5.2)
Sodium: 139 mmol/L (ref 134–144)

## 2020-03-30 LAB — HEPATIC FUNCTION PANEL
ALT: 26 IU/L (ref 0–44)
AST: 28 IU/L (ref 0–40)
Albumin: 4.3 g/dL (ref 3.8–4.9)
Alkaline Phosphatase: 61 IU/L (ref 39–117)
Bilirubin Total: 0.5 mg/dL (ref 0.0–1.2)
Bilirubin, Direct: 0.13 mg/dL (ref 0.00–0.40)
Total Protein: 6.9 g/dL (ref 6.0–8.5)

## 2020-03-30 LAB — TSH: TSH: 1.34 u[IU]/mL (ref 0.450–4.500)

## 2020-04-02 NOTE — Addendum Note (Signed)
Addended by: Eleonore Chiquito on: 04/02/2020 10:57 AM   Modules accepted: Orders

## 2020-04-26 ENCOUNTER — Other Ambulatory Visit: Payer: Self-pay | Admitting: Neurology

## 2020-04-26 MED ORDER — CYCLOBENZAPRINE HCL 10 MG PO TABS: 10.0000 mg | ORAL_TABLET | Freq: Three times a day (TID) | ORAL | 0 refills | Status: DC | PRN

## 2020-04-28 ENCOUNTER — Other Ambulatory Visit: Payer: Self-pay | Admitting: Nurse Practitioner

## 2020-04-28 DIAGNOSIS — F5101 Primary insomnia: Secondary | ICD-10-CM

## 2020-04-29 NOTE — Telephone Encounter (Signed)
Thanks agree.

## 2020-04-29 NOTE — Telephone Encounter (Signed)
I sent this for 30 days with note to pharmacy that patient needs an appointment

## 2020-05-01 ENCOUNTER — Other Ambulatory Visit: Payer: Self-pay

## 2020-05-01 ENCOUNTER — Encounter: Payer: Self-pay | Admitting: Physician Assistant

## 2020-05-01 ENCOUNTER — Ambulatory Visit (INDEPENDENT_AMBULATORY_CARE_PROVIDER_SITE_OTHER): Payer: 59 | Admitting: Physician Assistant

## 2020-05-01 VITALS — BP 136/94 | HR 74 | Ht 72.0 in | Wt 232.0 lb

## 2020-05-01 DIAGNOSIS — N1831 Chronic kidney disease, stage 3a: Secondary | ICD-10-CM | POA: Diagnosis not present

## 2020-05-01 DIAGNOSIS — M545 Low back pain, unspecified: Secondary | ICD-10-CM

## 2020-05-01 DIAGNOSIS — F5101 Primary insomnia: Secondary | ICD-10-CM

## 2020-05-01 DIAGNOSIS — I10 Essential (primary) hypertension: Secondary | ICD-10-CM

## 2020-05-01 LAB — POCT URINALYSIS DIP (CLINITEK)
Bilirubin, UA: NEGATIVE
Blood, UA: NEGATIVE
Glucose, UA: NEGATIVE mg/dL
Ketones, POC UA: NEGATIVE mg/dL
Leukocytes, UA: NEGATIVE
Nitrite, UA: NEGATIVE
POC PROTEIN,UA: NEGATIVE
Spec Grav, UA: 1.02 (ref 1.010–1.025)
Urobilinogen, UA: 0.2 E.U./dL
pH, UA: 6 (ref 5.0–8.0)

## 2020-05-01 MED ORDER — ZOLPIDEM TARTRATE 10 MG PO TABS: ORAL_TABLET | ORAL | 5 refills | Status: DC

## 2020-05-01 NOTE — Progress Notes (Signed)
Subjective:    Patient ID: Mason Rangel, male    DOB: [birth year], 54 y.o.   MRN: [id number]  HPI  Patient is a 54 year old male with primary insomnia who presents to the clinic for medication refill.  Is doing well on Ambien.  He does need it to sleep better.  He has no concerns or complaints concerning his sleep today.  Patient has had some left-sided low back pain recently.  He experienced this similar pain when he had a kidney stone at one point and also when he had Covid.  Most recent pain happened a few weeks ago but has since resolved.  He does note that the pain occurred again after his Covid vaccine.  He denies any problems urinating.  He denies any fever, chills, flank pain.  He denies any problems lifting, bending, twisting.  He denies any radiation of pain into legs.  Patient was seen by cardiology and started on a new blood pressure medicine amlodipine.  He has tolerated this well.  He was found to have some renal insufficiency.  Cardiology will see him next week to recheck his serum creatinine.  Last serum creatinine was 1.4.  We are hoping that controlling blood pressure will stabilize or improve renal function.  Patient does note that he is eating about 100 g of protein a day.  He is drinking lots of water.  .. Active Ambulatory Problems    Diagnosis Date Noted  . Left sided chest pain 04/06/2016  . Anxiety 04/06/2016  . Insomnia 04/06/2016  . Adjustment disorder with mixed anxiety and depressed mood 04/06/2016  . Essential hypertension, benign 04/06/2016  . GERD (gastroesophageal reflux disease) 04/06/2016  . Esophageal stenosis 04/06/2016  . Hypogonadism in male 10/19/2016  . LVH (left ventricular hypertrophy) due to hypertensive disease, without heart failure 03/24/2017  . Diastolic dysfunction 04/28/2017  . Plantar fasciitis 12/28/2017  . SVT (supraventricular tachycardia) (HCC) 07/18/2018  . Muscle spasm of back 07/18/2018  . Dizziness 08/09/2018  . Fever  08/09/2018  . 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 Low back pain 08/09/2018  . 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 Syncope 08/09/2018  . Thrombosed external hemorrhoid 11/20/2014  . Screening for cholesterol level 08/12/2018  . Palpitations 08/12/2018  . Rupture of left distal biceps tendon 06/22/2019  . Gastroesophageal reflux disease with esophagitis 09/28/2019  . Hypertensive crisis 09/28/2019  . Stage 3a chronic kidney disease 05/01/2020   Resolved Ambulatory Problems    Diagnosis Date Noted  . Swelling of foot joint 07/10/2016  . Right foot pain 07/10/2016  . Hypertension, uncontrolled 11/22/2017  . Epididymitis 08/09/2018   Past Medical History:  Diagnosis Date  . Gout   . Hypertension        Review of Systems See HPI>     Objective:   Physical Exam Vitals reviewed.  Constitutional:      Appearance: Normal appearance.  HENT:     Head: Normocephalic.  Cardiovascular:     Rate and Rhythm: Normal rate and regular rhythm.     Pulses: Normal pulses.  Pulmonary:     Effort: Pulmonary effort is normal.     Breath sounds: Normal breath sounds.  Abdominal:     General: There is no distension.     Palpations: Abdomen is soft.     Tenderness: There is no right CVA tenderness or left CVA tenderness.  Musculoskeletal:        General: No  swelling or tenderness. Normal range of motion.     Right lower leg: No edema.     Left lower leg: No edema.  Neurological:     General: No focal deficit present.     Mental Status: He is alert and oriented to person, place, and time.  Psychiatric:        Mood and Affect: Mood normal.        Behavior: Behavior normal.           Assessment & Plan:  [name] KitchenMarland KitchenDecari was seen today for back pain.  Diagnoses and all orders for this visit:  Primary insomnia -     zolpidem (AMBIEN) 10 MG tablet; TAKE 1 TABLET BY MOUTH AT BEDTIME  Stage 3a chronic kidney disease  Essential  hypertension, benign  Acute bilateral low back pain without sciatica   .[name] Kitchen Results for orders placed or performed in visit on 05/01/20  POCT URINALYSIS DIP (CLINITEK)  Result Value Ref Range   Color, UA yellow yellow   Clarity, UA clear clear   Glucose, UA negative negative mg/dL   Bilirubin, UA negative negative   Ketones, POC UA negative negative mg/dL   Spec Grav, UA 1.020 1.010 - 1.025   Blood, UA negative negative   pH, UA 6.0 5.0 - 8.0   POC PROTEIN,UA negative negative, trace   Urobilinogen, UA 0.2 0.2 or 1.0 E.U./dL   Nitrite, UA Negative Negative   Leukocytes, UA Negative Negative    Refilled Ambien for sleep.  Patient will be seeing cardiology next week for repeat BMP for kidney function.  His blood pressure does appear to be better than when he was seen cardiology.  I do think he does not get controlled.  They will likely increase amlodipine at that visit.  If his kidney function has not made any significant improvement I would consider cutting his protein in half.  That could be contributing some with his renal insufficiency.  Reiterated not to take any anti-inflammatories and stick to Tylenol for any pain control needs.   Low back pain is actually resolved.  We will check a urine just to make sure there is no protein in urine or spilling blood. UA negative for any protein or blood. Reassured that muscular or perhaps associated with covid vaccine.   Follow up in 6 months.

## 2020-06-25 ENCOUNTER — Other Ambulatory Visit: Payer: Self-pay | Admitting: Physician Assistant

## 2020-07-09 ENCOUNTER — Other Ambulatory Visit: Payer: Self-pay | Admitting: Physician Assistant

## 2020-07-09 DIAGNOSIS — F5101 Primary insomnia: Secondary | ICD-10-CM

## 2020-09-01 ENCOUNTER — Other Ambulatory Visit: Payer: Self-pay | Admitting: Physician Assistant

## 2020-09-01 DIAGNOSIS — I10 Essential (primary) hypertension: Secondary | ICD-10-CM

## 2020-09-12 ENCOUNTER — Other Ambulatory Visit: Payer: Self-pay | Admitting: Physician Assistant

## 2020-09-12 DIAGNOSIS — F5101 Primary insomnia: Secondary | ICD-10-CM

## 2020-11-06 ENCOUNTER — Telehealth: Payer: Self-pay | Admitting: Physician Assistant

## 2020-11-06 DIAGNOSIS — K2100 Gastro-esophageal reflux disease with esophagitis, without bleeding: Secondary | ICD-10-CM

## 2020-11-06 DIAGNOSIS — F5101 Primary insomnia: Secondary | ICD-10-CM

## 2020-11-06 DIAGNOSIS — I10 Essential (primary) hypertension: Secondary | ICD-10-CM

## 2020-11-06 MED ORDER — ESOMEPRAZOLE MAGNESIUM 40 MG PO CPDR: 40.0000 mg | DELAYED_RELEASE_CAPSULE | Freq: Every day | ORAL | 3 refills | Status: DC

## 2020-11-06 MED ORDER — ZOLPIDEM TARTRATE 10 MG PO TABS: ORAL_TABLET | ORAL | 5 refills | Status: DC

## 2020-11-06 MED ORDER — VALACYCLOVIR HCL 1 G PO TABS: 1000.0000 mg | ORAL_TABLET | Freq: Every day | ORAL | 1 refills | Status: DC

## 2020-11-06 MED ORDER — OLMESARTAN MEDOXOMIL-HCTZ 40-12.5 MG PO TABS: 1.0000 | ORAL_TABLET | Freq: Every day | ORAL | 1 refills | Status: DC

## 2020-11-06 NOTE — Telephone Encounter (Signed)
Needs refills

## 2020-11-11 ENCOUNTER — Other Ambulatory Visit: Payer: Self-pay | Admitting: Physician Assistant

## 2020-11-11 DIAGNOSIS — F5101 Primary insomnia: Secondary | ICD-10-CM

## 2020-11-13 ENCOUNTER — Other Ambulatory Visit: Payer: Self-pay | Admitting: Physician Assistant

## 2020-12-29 ENCOUNTER — Other Ambulatory Visit: Payer: Self-pay | Admitting: Physician Assistant

## 2020-12-29 DIAGNOSIS — F5101 Primary insomnia: Secondary | ICD-10-CM

## 2021-02-04 ENCOUNTER — Other Ambulatory Visit: Payer: Self-pay | Admitting: Physician Assistant

## 2021-02-04 DIAGNOSIS — F5101 Primary insomnia: Secondary | ICD-10-CM

## 2021-02-18 ENCOUNTER — Other Ambulatory Visit: Payer: Self-pay | Admitting: Physician Assistant

## 2021-02-18 DIAGNOSIS — F5101 Primary insomnia: Secondary | ICD-10-CM

## 2021-02-18 MED ORDER — TRAZODONE HCL 50 MG PO TABS: ORAL_TABLET | ORAL | 3 refills | Status: DC

## 2021-02-18 NOTE — Progress Notes (Signed)
CPE tomorrow. Needs refill tonight on trazodone.

## 2021-02-19 ENCOUNTER — Other Ambulatory Visit: Payer: Self-pay

## 2021-02-19 ENCOUNTER — Encounter: Payer: Self-pay | Admitting: Physician Assistant

## 2021-02-19 ENCOUNTER — Ambulatory Visit (INDEPENDENT_AMBULATORY_CARE_PROVIDER_SITE_OTHER): Payer: 59 | Admitting: Physician Assistant

## 2021-02-19 VITALS — BP 133/88 | HR 72 | Ht 72.0 in | Wt 231.0 lb

## 2021-02-19 DIAGNOSIS — Z13220 Encounter for screening for lipoid disorders: Secondary | ICD-10-CM

## 2021-02-19 DIAGNOSIS — Z0000 Encounter for general adult medical examination without abnormal findings: Secondary | ICD-10-CM | POA: Diagnosis not present

## 2021-02-19 DIAGNOSIS — N1831 Chronic kidney disease, stage 3a: Secondary | ICD-10-CM

## 2021-02-19 DIAGNOSIS — Z1159 Encounter for screening for other viral diseases: Secondary | ICD-10-CM

## 2021-02-19 DIAGNOSIS — Z131 Encounter for screening for diabetes mellitus: Secondary | ICD-10-CM

## 2021-02-19 DIAGNOSIS — Z125 Encounter for screening for malignant neoplasm of prostate: Secondary | ICD-10-CM

## 2021-02-19 DIAGNOSIS — E291 Testicular hypofunction: Secondary | ICD-10-CM

## 2021-02-19 DIAGNOSIS — Z1329 Encounter for screening for other suspected endocrine disorder: Secondary | ICD-10-CM

## 2021-02-19 DIAGNOSIS — I10 Essential (primary) hypertension: Secondary | ICD-10-CM

## 2021-02-19 NOTE — Progress Notes (Signed)
Subjective:    Patient ID: Mason Rangel, male    DOB: 1966-06-05, 55 y.o.   MRN: [id number]  HPI  Pt is a 55 yo male who presents to the clinic for annual exam.   He is exercising daily. He is eating clean. He is taking his BP medication. No concerns.    .. Active Ambulatory Problems    Diagnosis Date Noted  . Anxiety 04/06/2016  . Insomnia 04/06/2016  . Adjustment disorder with mixed anxiety and depressed mood 04/06/2016  . Essential hypertension, benign 04/06/2016  . GERD (gastroesophageal reflux disease) 04/06/2016  . Esophageal stenosis 04/06/2016  . Hypogonadism in male 10/19/2016  . LVH (left ventricular hypertrophy) due to hypertensive disease, without heart failure 03/24/2017  . Diastolic dysfunction 04/28/2017  . Plantar fasciitis 12/28/2017  . SVT (supraventricular tachycardia) (HCC) 07/18/2018  . Muscle spasm of back 07/18/2018  . Dizziness 08/09/2018  . 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 Low back pain 08/09/2018  . 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 Syncope 08/09/2018  . Thrombosed external hemorrhoid 11/20/2014  . Palpitations 08/12/2018  . Rupture of left distal biceps tendon 06/22/2019  . Gastroesophageal reflux disease with esophagitis 09/28/2019  . Hypertensive crisis 09/28/2019  . Stage 3a chronic kidney disease (HCC) 05/01/2020   Resolved Ambulatory Problems    Diagnosis Date Noted  . Left sided chest pain 04/06/2016  . Swelling of foot joint 07/10/2016  . Right foot pain 07/10/2016  . Hypertension, uncontrolled 11/22/2017  . Epididymitis 08/09/2018  . Fever 08/09/2018  . Screening for cholesterol level 08/12/2018  . Acute bilateral low back pain without sciatica 05/01/2020   Past Medical History:  Diagnosis Date  . Gout   . Hypertension       Review of Systems  All other systems reviewed and are negative.      Objective:   Physical Exam BP 133/88   Pulse 72   Ht 6'  (1.829 m)   Wt 231 lb (104.8 kg)   SpO2 99%   BMI 31.33 kg/m   General Appearance:    Alert, cooperative, no distress, appears stated age  Head:    Normocephalic, without obvious abnormality, atraumatic  Eyes:    PERRL, conjunctiva/corneas clear, EOM's intact, fundi    benign, both eyes       Ears:    Normal TM's and external ear canals, both ears  Nose:   Nares normal, septum midline, mucosa normal, no drainage    or sinus tenderness  Throat:   Lips, mucosa, and tongue normal; teeth and gums normal  Neck:   Supple, symmetrical, trachea midline, no adenopathy;       thyroid:  No enlargement/tenderness/nodules; no carotid   bruit or JVD  Back:     Symmetric, no curvature, ROM normal, no CVA tenderness  Lungs:     Clear to auscultation bilaterally, respirations unlabored  Chest wall:    No tenderness or deformity  Heart:    Regular rate and rhythm, S1 and S2 normal, no murmur, rub   or gallop  Abdomen:     Soft, non-tender, bowel sounds active all four quadrants,    no masses, no organomegaly        Extremities:   Extremities normal, atraumatic, no cyanosis or edema  Pulses:   2+ and symmetric all extremities  Skin:   Skin color, texture, turgor normal, no rashes or lesions  Lymph nodes:   Cervical, supraclavicular,  and axillary nodes normal  Neurologic:   CNII-XII intact. Normal strength, sensation and reflexes      throughout   .[name] Kitchen Depression screen [facility] 2/9 02/19/2021 09/27/2019 11/13/2018 11/22/2017 03/24/2017  Decreased Interest 0 0 0 1 2  Down, Depressed, Hopeless 0 0 0 1 2  PHQ - 2 Score 0 0 0 2 4  Altered sleeping - 1 2 0 0  Tired, decreased energy - 0 1 1 1   Change in appetite - 0 0 1 1  Feeling bad or failure about yourself  - 0 0 0 1  Trouble concentrating - 1 0 0 1  Moving slowly or fidgety/restless - 1 0 1 0  Suicidal thoughts - 0 0 0 0  PHQ-9 Score - 3 3 5 8   Difficult doing work/chores - Not difficult at all Not difficult at all - -   . GAD 7 : Generalized  Anxiety Score 09/27/2019 03/24/2017  Nervous, Anxious, on Edge 1 1  Control/stop worrying 0 1  Worry too much - different things 1 1  Trouble relaxing 1 1  Restless 1 1  Easily annoyed or irritable 1 1  Afraid - awful might happen 0 1  Total GAD 7 Score 5 7  Anxiety Difficulty Not difficult at all Somewhat difficult    .09/29/2019  RO-AUA SYMPTOM    Row Name 02/19/21 0900         During the last Month   Sensation of Bladder not Empty Not at all     Urinate<2 hours after last Less than half the time     Mult. stop/start when voiding Not at all     Difficult to postpone voiding About half the time     Weak urinary stream Not at all     Push/strain to begin urination Less than 1 time in 5     Times per night up to urinate Less than half the time           OTHER   Total Score 8                 Assessment & Plan:  [name] Kitchen03/18/22Hien was seen today for annual exam.  Diagnoses and all orders for this visit:  Routine physical examination -     CBC with Differential/Platelet -     COMPLETE METABOLIC PANEL WITH GFR -     PSA -     Lipid Panel w/reflex Direct LDL -     TSH -     Testosterone  Screening for diabetes mellitus -     COMPLETE METABOLIC PANEL WITH GFR  Screening for lipid disorders -     Lipid Panel w/reflex Direct LDL  Stage 3a chronic kidney disease (HCC) -     COMPLETE METABOLIC PANEL WITH GFR  Hypogonadism in male -     Testosterone  Thyroid disorder screen -     TSH  Screening PSA (prostate specific antigen) -     PSA  Encounter for hepatitis C screening test for low risk patient -     Hepatitis C Antibody  Essential hypertension, benign   .[name] KitchenStart a regular exercise program and make sure you are eating a healthy diet Try to eat 4 servings of dairy a day or take a calcium supplement (500mg  twice a day). covid vaccine 2 shots completed not done booster.  Flu vaccine declined.  Discussed shingrix. Colonoscopy UTD per patient last 2016.  AUA up some. PSA  ordered.  BP to  goal.   Follow up in 6 months.

## 2021-02-19 NOTE — Patient Instructions (Signed)

## 2021-02-21 NOTE — Progress Notes (Signed)
Mason Rangel,   Kidney function much improved!  Cholesterol GREAT.  Thyroid wonderful.  Testosterone on the low side. Have you missed any testosterone doses? We could consider increasing ML a little.  Your fasting sugar was just a tad elevated. It could have been the oatmeal that morning. Will add A!C to get a better look.

## 2021-02-22 LAB — CBC WITH DIFFERENTIAL/PLATELET
Absolute Monocytes: 541 cells/uL (ref 200–950)
Basophils Absolute: 68 cells/uL (ref 0–200)
Basophils Relative: 1.3 %
Eosinophils Absolute: 161 cells/uL (ref 15–500)
Eosinophils Relative: 3.1 %
HCT: 44.4 % (ref 38.5–50.0)
Hemoglobin: 15.5 g/dL (ref 13.2–17.1)
Lymphs Abs: 1747 cells/uL (ref 850–3900)
MCH: 32.2 pg (ref 27.0–33.0)
MCHC: 34.9 g/dL (ref 32.0–36.0)
MCV: 92.1 fL (ref 80.0–100.0)
MPV: 10.8 fL (ref 7.5–12.5)
Monocytes Relative: 10.4 %
Neutro Abs: 2683 cells/uL (ref 1500–7800)
Neutrophils Relative %: 51.6 %
Platelets: 151 10*3/uL (ref 140–400)
RBC: 4.82 10*6/uL (ref 4.20–5.80)
RDW: 14 % (ref 11.0–15.0)
Total Lymphocyte: 33.6 %
WBC: 5.2 10*3/uL (ref 3.8–10.8)

## 2021-02-22 LAB — LIPID PANEL W/REFLEX DIRECT LDL
Cholesterol: 167 mg/dL (ref <200)
HDL: 44 mg/dL (ref >=40)
LDL Cholesterol (Calc): 102 mg/dL (calc) — ABNORMAL HIGH
Non-HDL Cholesterol (Calc): 123 mg/dL (calc) (ref <130)
Total CHOL/HDL Ratio: 3.8 (calc) (ref <5.0)
Triglycerides: 118 mg/dL (ref <150)

## 2021-02-22 LAB — COMPLETE METABOLIC PANEL WITH GFR
AG Ratio: 1.6 (calc) (ref 1.0–2.5)
ALT: 25 U/L (ref 9–46)
AST: 23 U/L (ref 10–35)
Albumin: 4.2 g/dL (ref 3.6–5.1)
Alkaline phosphatase (APISO): 51 U/L (ref 35–144)
BUN: 19 mg/dL (ref 7–25)
CO2: 30 mmol/L (ref 20–32)
Calcium: 9.5 mg/dL (ref 8.6–10.3)
Chloride: 101 mmol/L (ref 98–110)
Creat: 1.28 mg/dL (ref 0.70–1.33)
GFR, Est African American: 72 mL/min/{1.73_m2} (ref >=60)
GFR, Est Non African American: 62 mL/min/{1.73_m2} (ref >=60)
Globulin: 2.7 g/dL (calc) (ref 1.9–3.7)
Glucose, Bld: 106 mg/dL — ABNORMAL HIGH (ref 65–99)
Potassium: 4 mmol/L (ref 3.5–5.3)
Sodium: 137 mmol/L (ref 135–146)
Total Bilirubin: 0.4 mg/dL (ref 0.2–1.2)
Total Protein: 6.9 g/dL (ref 6.1–8.1)

## 2021-02-22 LAB — PSA: PSA: 0.42 ng/mL (ref <=4.0)

## 2021-02-22 LAB — HEPATITIS C ANTIBODY
Hepatitis C Ab: NONREACTIVE
SIGNAL TO CUT-OFF: 0.01 (ref <1.00)

## 2021-02-22 LAB — TSH: TSH: 1.24 mIU/L (ref 0.40–4.50)

## 2021-02-22 LAB — HEMOGLOBIN A1C W/OUT EAG: Hgb A1c MFr Bld: 5.2 % of total Hgb (ref <5.7)

## 2021-02-22 LAB — TESTOSTERONE: Testosterone: 295 ng/dL (ref 250–827)

## 2021-02-24 NOTE — Progress Notes (Signed)
A1C is 5.2 despite fasting glucose being a little high. That A1C stable and good. No signs of diabetes.

## 2021-05-10 ENCOUNTER — Other Ambulatory Visit: Payer: Self-pay | Admitting: Physician Assistant

## 2021-05-10 DIAGNOSIS — F5101 Primary insomnia: Secondary | ICD-10-CM

## 2021-05-12 NOTE — Telephone Encounter (Signed)
Last written December 1st #30 with 5 refills Last appt 02/19/2021

## 2021-05-28 ENCOUNTER — Ambulatory Visit: Payer: 59 | Admitting: Physician Assistant

## 2021-07-07 ENCOUNTER — Other Ambulatory Visit: Payer: Self-pay

## 2021-07-07 DIAGNOSIS — I10 Essential (primary) hypertension: Secondary | ICD-10-CM

## 2021-07-07 MED ORDER — OLMESARTAN MEDOXOMIL-HCTZ 40-12.5 MG PO TABS: 1.0000 | ORAL_TABLET | Freq: Every day | ORAL | 0 refills | Status: DC

## 2021-08-13 ENCOUNTER — Other Ambulatory Visit: Payer: Self-pay | Admitting: Physician Assistant

## 2021-08-13 DIAGNOSIS — F5101 Primary insomnia: Secondary | ICD-10-CM

## 2021-08-13 NOTE — Telephone Encounter (Signed)
Call patient to schedule 69-month follow-up.  Last seen in March.  I did go ahead and send over 30 days of the Ambien so that gives him a month to get in to make an appointment with Jade.  Meds ordered this encounter  Medications   zolpidem (AMBIEN) 10 MG tablet    Sig: TAKE 1 TABLET BY MOUTH AT BEDTIME    Dispense:  30 tablet    Refill:  0

## 2021-08-13 NOTE — Telephone Encounter (Signed)
Last written 05/12/2021 #90 no refills Last appt 02/19/2021 [facility] patient

## 2021-08-13 NOTE — Telephone Encounter (Signed)
Left voicemail message for patient to call back to get his 24month follow up appointment scheduled. AM

## 2021-08-19 ENCOUNTER — Other Ambulatory Visit: Payer: Self-pay

## 2021-08-19 ENCOUNTER — Ambulatory Visit (INDEPENDENT_AMBULATORY_CARE_PROVIDER_SITE_OTHER): Payer: 59 | Admitting: Physician Assistant

## 2021-08-19 VITALS — BP 121/82 | HR 84 | Ht 72.0 in | Wt 222.0 lb

## 2021-08-19 DIAGNOSIS — Z23 Encounter for immunization: Secondary | ICD-10-CM | POA: Diagnosis not present

## 2021-08-19 DIAGNOSIS — K2100 Gastro-esophageal reflux disease with esophagitis, without bleeding: Secondary | ICD-10-CM | POA: Diagnosis not present

## 2021-08-19 DIAGNOSIS — I10 Essential (primary) hypertension: Secondary | ICD-10-CM | POA: Diagnosis not present

## 2021-08-19 DIAGNOSIS — F5101 Primary insomnia: Secondary | ICD-10-CM | POA: Diagnosis not present

## 2021-08-19 MED ORDER — AMLODIPINE BESYLATE 10 MG PO TABS: 10.0000 mg | ORAL_TABLET | Freq: Every day | ORAL | 1 refills | Status: DC

## 2021-08-19 MED ORDER — ZOLPIDEM TARTRATE 10 MG PO TABS: 10.0000 mg | ORAL_TABLET | Freq: Every day | ORAL | 1 refills | Status: DC

## 2021-08-19 MED ORDER — OLMESARTAN MEDOXOMIL-HCTZ 40-12.5 MG PO TABS: 1.0000 | ORAL_TABLET | Freq: Every day | ORAL | 1 refills | Status: DC

## 2021-08-19 MED ORDER — ESOMEPRAZOLE MAGNESIUM 40 MG PO CPDR: 40.0000 mg | DELAYED_RELEASE_CAPSULE | Freq: Every day | ORAL | 3 refills | Status: DC

## 2021-08-19 NOTE — Progress Notes (Signed)
Subjective:    Patient ID: Mason Rangel, male    DOB: [birth year], 55 y.o.   MRN: [id number]  HPI Pt is a 55 yo male with HTN, hypogonadism, CKD, GERD, insomnia who presents to the clinic for 6 month follow up.   Pt is doing great. He is very active. He denies any mood issues or concerns. Denies CP, palpitations, headaches, or vision changes. He is sleeping well alternating trazodone and ambien.   .. Active Ambulatory Problems    Diagnosis Date Noted   Anxiety 04/06/2016   Insomnia 04/06/2016   Adjustment disorder with mixed anxiety and depressed mood 04/06/2016   Essential hypertension, benign 04/06/2016   GERD (gastroesophageal reflux disease) 04/06/2016   Esophageal stenosis 04/06/2016   Hypogonadism in male 10/19/2016   LVH (left ventricular hypertrophy) due to hypertensive disease, without heart failure 03/24/2017   Diastolic dysfunction 04/28/2017   Plantar fasciitis 12/28/2017   SVT (supraventricular tachycardia) (HCC) 07/18/2018   Muscle spasm of back 07/18/2018   Dizziness 08/09/2018   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   Low back pain 08/09/2018   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   Syncope 08/09/2018   Thrombosed external hemorrhoid 11/20/2014   Palpitations 08/12/2018   Rupture of left distal biceps tendon 06/22/2019   Gastroesophageal reflux disease with esophagitis 09/28/2019   Hypertensive crisis 09/28/2019   Stage 3a chronic kidney disease (HCC) 05/01/2020   Resolved Ambulatory Problems    Diagnosis Date Noted   Left sided chest pain 04/06/2016   Swelling of foot joint 07/10/2016   Right foot pain 07/10/2016   Hypertension, uncontrolled 11/22/2017   Epididymitis 08/09/2018   Fever 08/09/2018   Screening for cholesterol level 08/12/2018   Acute bilateral low back pain without sciatica 05/01/2020   Past Medical History:  Diagnosis Date   Gout    Hypertension          Review of Systems   All other systems reviewed and are negative.     Objective:   Physical Exam Vitals reviewed.  Constitutional:      Appearance: Normal appearance.  HENT:     Head: Normocephalic.  Cardiovascular:     Rate and Rhythm: Normal rate and regular rhythm.     Pulses: Normal pulses.     Heart sounds: Normal heart sounds.  Pulmonary:     Effort: Pulmonary effort is normal.     Breath sounds: Normal breath sounds.  Neurological:     General: No focal deficit present.     Mental Status: He is alert and oriented to person, place, and time.  Psychiatric:        Mood and Affect: Mood normal.          Assessment & Plan:  [name] KitchenMarland KitchenBrazos was seen today for follow-up.  Diagnoses and all orders for this visit:  Essential hypertension, benign -     olmesartan-hydrochlorothiazide (BENICAR HCT) 40-12.5 MG tablet; Take 1 tablet by mouth daily. -     COMPLETE METABOLIC PANEL WITH GFR -     amLODipine (NORVASC) 10 MG tablet; Take 1 tablet (10 mg total) by mouth daily.  Needs flu shot -     Flu Vaccine QUAD 33mo+IM (Fluarix, Fluzone & Alfiuria Quad PF)  Primary insomnia -     zolpidem (AMBIEN) 10 MG tablet; Take 1 tablet (10 mg total) by mouth at bedtime. -     COMPLETE METABOLIC PANEL WITH GFR  Gastroesophageal reflux disease with esophagitis  without hemorrhage -     esomeprazole (NEXIUM) 40 MG capsule; Take 1 capsule (40 mg total) by mouth daily.  Doing great.  Cmp ordered.  Vitals look great.  Refilled medications for 6 months.  Flu shot given.  Pt declined shingrix today.

## 2021-08-20 ENCOUNTER — Encounter: Payer: Self-pay | Admitting: Physician Assistant

## 2021-08-20 LAB — COMPLETE METABOLIC PANEL WITH GFR
AG Ratio: 1.6 (calc) (ref 1.0–2.5)
ALT: 30 U/L (ref 9–46)
AST: 27 U/L (ref 10–35)
Albumin: 4.4 g/dL (ref 3.6–5.1)
Alkaline phosphatase (APISO): 47 U/L (ref 35–144)
BUN: 25 mg/dL (ref 7–25)
CO2: 27 mmol/L (ref 20–32)
Calcium: 9.8 mg/dL (ref 8.6–10.3)
Chloride: 101 mmol/L (ref 98–110)
Creat: 1.21 mg/dL (ref 0.70–1.30)
Globulin: 2.8 g/dL (calc) (ref 1.9–3.7)
Glucose, Bld: 88 mg/dL (ref 65–99)
Potassium: 3.9 mmol/L (ref 3.5–5.3)
Sodium: 137 mmol/L (ref 135–146)
Total Bilirubin: 0.7 mg/dL (ref 0.2–1.2)
Total Protein: 7.2 g/dL (ref 6.1–8.1)
eGFR: 70 mL/min/{1.73_m2} (ref >=60)

## 2021-08-20 NOTE — Progress Notes (Signed)
Kidneys continue to improve.

## 2021-12-08 ENCOUNTER — Other Ambulatory Visit: Payer: Self-pay | Admitting: Physician Assistant

## 2021-12-08 MED ORDER — HYDROCOD POLST-CPM POLST ER 10-8 MG/5ML PO SUER: 5.0000 mL | Freq: Two times a day (BID) | ORAL | 0 refills | Status: DC | PRN

## 2021-12-12 ENCOUNTER — Telehealth: Payer: Self-pay | Admitting: Neurology

## 2021-12-12 NOTE — Telephone Encounter (Signed)
Received vm from patient that states he was told by the pharmacy he needs a PA for Ambien to take a whole pill, they only want to pay for half a pill a night without a PA. He has two pills left, can we work on this?

## 2021-12-13 ENCOUNTER — Telehealth: Payer: Self-pay

## 2021-12-13 NOTE — Telephone Encounter (Signed)
Medication: zolpidem (AMBIEN) 10 MG tablet Prior authorization submitted via CoverMyMeds on 12/13/2021 PA submission pending

## 2021-12-25 DIAGNOSIS — E291 Testicular hypofunction: Secondary | ICD-10-CM | POA: Diagnosis not present

## 2022-01-01 DIAGNOSIS — E663 Overweight: Secondary | ICD-10-CM | POA: Diagnosis not present

## 2022-01-01 DIAGNOSIS — I10 Essential (primary) hypertension: Secondary | ICD-10-CM | POA: Diagnosis not present

## 2022-01-01 DIAGNOSIS — E291 Testicular hypofunction: Secondary | ICD-10-CM | POA: Diagnosis not present

## 2022-01-01 DIAGNOSIS — Z6832 Body mass index (BMI) 32.0-32.9, adult: Secondary | ICD-10-CM | POA: Diagnosis not present

## 2022-02-02 ENCOUNTER — Other Ambulatory Visit: Payer: Self-pay | Admitting: Physician Assistant

## 2022-02-02 MED ORDER — CYCLOBENZAPRINE HCL 5 MG PO TABS: 5.0000 mg | ORAL_TABLET | Freq: Three times a day (TID) | ORAL | 1 refills | Status: DC | PRN

## 2022-02-02 NOTE — Progress Notes (Signed)
Muscle spasms sent flexeril.

## 2022-02-17 ENCOUNTER — Ambulatory Visit (INDEPENDENT_AMBULATORY_CARE_PROVIDER_SITE_OTHER): Payer: 59 | Admitting: Physician Assistant

## 2022-02-17 ENCOUNTER — Encounter: Payer: Self-pay | Admitting: Physician Assistant

## 2022-02-17 ENCOUNTER — Other Ambulatory Visit: Payer: Self-pay

## 2022-02-17 VITALS — BP 137/88 | HR 76 | Ht 72.0 in | Wt 229.0 lb

## 2022-02-17 DIAGNOSIS — Z131 Encounter for screening for diabetes mellitus: Secondary | ICD-10-CM

## 2022-02-17 DIAGNOSIS — I10 Essential (primary) hypertension: Secondary | ICD-10-CM | POA: Diagnosis not present

## 2022-02-17 DIAGNOSIS — Z79899 Other long term (current) drug therapy: Secondary | ICD-10-CM | POA: Diagnosis not present

## 2022-02-17 DIAGNOSIS — Z125 Encounter for screening for malignant neoplasm of prostate: Secondary | ICD-10-CM

## 2022-02-17 DIAGNOSIS — E291 Testicular hypofunction: Secondary | ICD-10-CM | POA: Diagnosis not present

## 2022-02-17 DIAGNOSIS — Z1329 Encounter for screening for other suspected endocrine disorder: Secondary | ICD-10-CM

## 2022-02-17 DIAGNOSIS — R69 Illness, unspecified: Secondary | ICD-10-CM | POA: Diagnosis not present

## 2022-02-17 DIAGNOSIS — F5101 Primary insomnia: Secondary | ICD-10-CM

## 2022-02-17 DIAGNOSIS — Z13220 Encounter for screening for lipoid disorders: Secondary | ICD-10-CM | POA: Diagnosis not present

## 2022-02-17 MED ORDER — VALACYCLOVIR HCL 1 G PO TABS: 1000.0000 mg | ORAL_TABLET | Freq: Every day | ORAL | 1 refills | Status: DC

## 2022-02-17 MED ORDER — ZOLPIDEM TARTRATE 10 MG PO TABS: 10.0000 mg | ORAL_TABLET | Freq: Every day | ORAL | 1 refills | Status: DC

## 2022-02-17 MED ORDER — TRAZODONE HCL 50 MG PO TABS: ORAL_TABLET | ORAL | 3 refills | Status: DC

## 2022-02-17 MED ORDER — OLMESARTAN MEDOXOMIL-HCTZ 40-12.5 MG PO TABS: 1.0000 | ORAL_TABLET | Freq: Every day | ORAL | 1 refills | Status: DC

## 2022-02-17 MED ORDER — AMLODIPINE BESYLATE 10 MG PO TABS: 10.0000 mg | ORAL_TABLET | Freq: Every day | ORAL | 1 refills | Status: DC

## 2022-02-17 NOTE — Progress Notes (Addendum)
? ?Subjective:  ? ? Patient ID: Mason Rangel, male    DOB: Jan 31, 1966, 56 y.o.   MRN: [id number] ? ?HPI ? ?56 yo male with history of HTN, hypogonadism, CKD GERD, and insomnia presents to the clinic for 6 mo follow up today.  ? ?Pt reports to be doing well and is currently taking his Benicar and Amlodipine as prescribed. His BP was slightly elevated today from the last visit which he attributes to not being able to take his Amlodpine for 3 weeks due to insurance. He has been back on it for 7 days now. Denies CP, SOB, HA, or dizziness.  ? ?.. ?Active Ambulatory Problems  ?  Diagnosis Date Noted  ? Anxiety 04/06/2016  ? Insomnia 04/06/2016  ? Adjustment disorder with mixed anxiety and depressed mood 04/06/2016  ? Essential hypertension, benign 04/06/2016  ? GERD (gastroesophageal reflux disease) 04/06/2016  ? Esophageal stenosis 04/06/2016  ? Hypogonadism in male 10/19/2016  ? LVH (left ventricular hypertrophy) due to hypertensive disease, without heart failure 03/24/2017  ? Diastolic dysfunction 04/28/2017  ? Plantar fasciitis 12/28/2017  ? SVT (supraventricular tachycardia) (HCC) 07/18/2018  ? Muscle spasm of back 07/18/2018  ? Dizziness 08/09/2018  ? 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  ? Low back pain 08/09/2018  ? 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  ? Syncope 08/09/2018  ? Thrombosed external hemorrhoid 11/20/2014  ? Palpitations 08/12/2018  ? Rupture of left distal biceps tendon 06/22/2019  ? Gastroesophageal reflux disease with esophagitis 09/28/2019  ? Hypertensive crisis 09/28/2019  ? Stage 3a chronic kidney disease (HCC) 05/01/2020  ? ?Resolved Ambulatory Problems  ?  Diagnosis Date Noted  ? Left sided chest pain 04/06/2016  ? Swelling of foot joint 07/10/2016  ? Right foot pain 07/10/2016  ? Hypertension, uncontrolled 11/22/2017  ? Epididymitis 08/09/2018  ? Fever 08/09/2018  ? Screening for cholesterol level 08/12/2018  ? Acute bilateral low back pain  without sciatica 05/01/2020  ? ?Past Medical History:  ?Diagnosis Date  ? Gout   ? Hypertension   ? ? ? ?Review of Systems ?All other systems reviewed and are negative.  ?   ?Objective:  ? Physical Exam ? ?Vitals reviewed.  ?Constitutional:   ?   Appearance: Normal appearance.  ?HENT:  ?   Head: Normocephalic.  ?Cardiovascular:  ?   Rate and Rhythm: Normal rate and regular rhythm.  ?   Pulses: Normal pulses.  ?   Heart sounds: Murmur auscultated - ECHO completed ?Pulmonary:  ?   Effort: Pulmonary effort is normal.  ?   Breath sounds: Normal breath sounds.  ?Neurological:  ?   General: No focal deficit present.  ?   Mental Status: He is alert and oriented to person, place, and time.  ?Psychiatric:     ?   Mood and Affect: Mood normal.  ? ?.. ?Depression screen [facility] 2/9 02/17/2022 02/17/2022 08/19/2021 02/19/2021 09/27/2019  ?Decreased Interest 0 0 0 0 0  ?Down, Depressed, Hopeless 0 0 0 0 0  ?PHQ - 2 Score 0 0 0 0 0  ?Altered sleeping 1 - - - 1  ?Tired, decreased energy 0 - - - 0  ?Change in appetite 1 - - - 0  ?Feeling bad or failure about yourself  0 - - - 0  ?Trouble concentrating 0 - - - 1  ?Moving slowly or fidgety/restless 1 - - - 1  ?Suicidal thoughts 0 - - - 0  ?PHQ-9 Score 3 - - -  3  ?Difficult doing work/chores Not difficult at all - - - Not difficult at all  ? ?.. ?GAD 7 : Generalized Anxiety Score 02/17/2022 09/27/2019 03/24/2017  ?Nervous, Anxious, on Edge 1 1 1   ?Control/stop worrying 1 0 1  ?Worry too much - different things 1 1 1   ?Trouble relaxing 1 1 1   ?Restless 1 1 1   ?Easily annoyed or irritable 0 1 1  ?Afraid - awful might happen 0 0 1  ?Total GAD 7 Score 5 5 7   ?Anxiety Difficulty Not difficult at all Not difficult at all Somewhat difficult  ? ? ? ?   ?Assessment & Plan:  ? ?56 yo male seen for follow-up today and is doing well. BP is slightly elevated which is likely due to not being able to take amlodipine for 3 weeks.  ? ?Diagnosis and all orders for this visit: ?  ?Essential hypertension,  benign ?-     olmesartan-hydrochlorothiazide (BENICAR HCT) 40-12.5 MG tablet; Take 1 tablet by mouth daily. ?-     COMPLETE METABOLIC PANEL WITH GFR ?-     amLODipine (NORVASC) 10 MG tablet; Take 1 tablet (10 mg total) by mouth daily. ?-     Continue to check BP at home and monitor for changes ?  ?Needs Shingrix Vaccine ?-     Will reschedule nurse visit to complete this ?  ?Primary insomnia ?-     zolpidem (AMBIEN) 10 MG tablet; Take 1 tablet (10 mg total) by mouth at bedtime. ?-     TraZODone (DESYREL) 50 MG tablet; TAKE 1 TO 2 TABLETS BY MOUTH AT BEDTIME AS NEEDED ?-     COMPLETE METABOLIC PANEL WITH GFR ?  ?Gastroesophageal reflux disease with esophagitis without hemorrhage ?-     esomeprazole (NEXIUM) 40 MG capsule; Take 1 capsule (40 mg total) by mouth daily. ?  ?Doing great.  ?BP borderline today. Decrease salt intake. Watch alcohol consumption.  ?Cmp ordered.   ?Fasting labs ordered ?Refilled medications for 6 months.    ?

## 2022-02-17 NOTE — Patient Instructions (Signed)
Heart Murmur A heart murmur is an extra sound that is caused by turbulent blood flow through the valves of the heart. The murmur can be heard as a "hum" or "whoosh" sound when blood flows through the heart. The heart has four areas called chambers. There is a valve for each chamber for the heart. Blood passes through a valve before leaving the chamber. Two of the valves move the blood from the upper chambers of the heart to the lower chambers of the heart (tricuspid valve and mitral valve). The other two valves (aortic valve and pulmonary valve) move the blood to the lungs and the rest of the body. The valves keep blood moving through the heart in the right direction. When the heart valves are not working properly it may cause a murmur. There are two types of heart murmurs: Innocent (benign) murmurs. Most people with this type of heart murmur do not have a heart problem. Many children have innocent heart murmurs. Your health care provider may suggest some basic tests to find out whether your murmur is an innocent murmur. If an innocent heart murmur is found, there is no need for further tests or treatment and no need to restrict activities or stop playing sports. Abnormal murmurs. These types of murmurs can occur in children and adults. Abnormal murmurs may be a sign of a more serious heart condition, such as a heart abnormality present at birth (congenital defect) or heart valve disease. What are the causes? This condition may also be caused by: Pregnancy. Fever. Overactive thyroid gland. Anemia. Exercise. Rapid growth spurts in children. What are the signs or symptoms? Innocent murmurs do not cause symptoms, and many people with abnormal murmurs may not have symptoms. If symptoms do develop, they may include: Shortness of breath or persistent cough. Blue coloring of the skin, especially on the fingertips. Chest pain. Palpitations, or feeling a fluttering or skipped  heartbeat. Fainting. Getting tired much faster than expected. Swelling in the abdomen, feet, or ankles. How is this diagnosed? This condition may be diagnosed during a routine physical or other exam. If your health care provider hears a murmur with a stethoscope, he or she will listen for: Where the murmur is located in your heart. How loud the murmur is. This may help the health care provider figure out what is causing the murmur. You may be referred to a heart specialist (cardiologist). You may also have other tests, including: Electrocardiogram (ECG or EKG). This test measures the electrical activity of your heart. Echocardiogram. This test uses high frequency sound waves to make pictures of your heart. Chest X-ray. Magnetic resonance imaging (MRI). Cardiac catheterization. This test looks at blood flow through the arteries around the heart. For children and adults who have an abnormal heart murmur and want to stay active, it is important to: Complete testing. Review test results. Receive recommendations from your health care provider. If heart disease is present, it may not be safe to play or be involved in activities that require a lot of effort and energy (are strenuous). How is this treated? Heart murmurs themselves do not need treatment. In some cases, a heart murmur may go away on its own. If an underlying problem or disease is causing the murmur, you may need treatment. If treatment is needed, it will depend on the type and severity of the disease or heart problem causing the murmur. Treatment may include: Medicine. Surgery. Changes to your lifestyle and diet. Follow these instructions at home: Talk with your   health care provider before participating in sports or other activities that are strenuous. Learn as much as possible about your condition and any related diseases. Ask your health care provider if you may be at risk for any medical emergencies. Talk with your health care  provider about what symptoms you should look out for. It is up to you to get your test results. Ask your health care provider, or the department that is doing the test, when your results will be ready. Keep all follow-up visits. This is important. Contact a health care provider if: You are frequently short of breath. You feel more tired than usual. You are having a hard time keeping up with normal activities or fitness routines. You have swelling in your ankles or feet. You notice that your heart often beats irregularly. You develop any new symptoms. Get help right away if: You have chest pain. You are having trouble breathing. You feel light-headed or you faint. Your symptoms suddenly get worse. These symptoms may represent a serious problem that is an emergency. Do not wait to see if the symptoms will go away. Get medical help right away. Call your local emergency services (911 in the [place] Do not drive yourself to the hospital. Summary Heart valves keep blood moving through the heart in the right direction. When the valves are not working properly it may cause a murmur. Innocent murmurs do not cause symptoms, and many people with abnormal murmurs may not have symptoms. You may need treatment if an underlying problem or disease is causing the heart murmur. Treatment may include medicine, surgery, and changes to your lifestyle and diet. Talk with your health care provider before participating in sports or other activities that are strenuous. Get help right away if you have chest pain or trouble breathing. This information is not intended to replace advice given to you by your health care provider. Make sure you discuss any questions you have with your health care provider. Document Revised: 03/03/2021 Document Reviewed: 03/03/2021 Elsevier Patient Education  2022 [employer].  

## 2022-02-18 ENCOUNTER — Encounter: Payer: Self-pay | Admitting: Physician Assistant

## 2022-02-18 DIAGNOSIS — E7800 Pure hypercholesterolemia, unspecified: Secondary | ICD-10-CM | POA: Insufficient documentation

## 2022-02-18 LAB — CBC WITH DIFFERENTIAL/PLATELET
Absolute Monocytes: 474 cells/uL (ref 200–950)
Basophils Absolute: 61 cells/uL (ref 0–200)
Basophils Relative: 1.2 %
Eosinophils Absolute: 82 cells/uL (ref 15–500)
Eosinophils Relative: 1.6 %
HCT: 42.5 % (ref 38.5–50.0)
Hemoglobin: 14.6 g/dL (ref 13.2–17.1)
Lymphs Abs: 1321 cells/uL (ref 850–3900)
MCH: 31.7 pg (ref 27.0–33.0)
MCHC: 34.4 g/dL (ref 32.0–36.0)
MCV: 92.2 fL (ref 80.0–100.0)
MPV: 11.5 fL (ref 7.5–12.5)
Monocytes Relative: 9.3 %
Neutro Abs: 3162 cells/uL (ref 1500–7800)
Neutrophils Relative %: 62 %
Platelets: 160 10*3/uL (ref 140–400)
RBC: 4.61 10*6/uL (ref 4.20–5.80)
RDW: 14.5 % (ref 11.0–15.0)
Total Lymphocyte: 25.9 %
WBC: 5.1 10*3/uL (ref 3.8–10.8)

## 2022-02-18 LAB — COMPLETE METABOLIC PANEL WITH GFR
AG Ratio: 1.6 (calc) (ref 1.0–2.5)
ALT: 40 U/L (ref 9–46)
AST: 30 U/L (ref 10–35)
Albumin: 4.5 g/dL (ref 3.6–5.1)
Alkaline phosphatase (APISO): 52 U/L (ref 35–144)
BUN: 16 mg/dL (ref 7–25)
CO2: 28 mmol/L (ref 20–32)
Calcium: 9.9 mg/dL (ref 8.6–10.3)
Chloride: 101 mmol/L (ref 98–110)
Creat: 1.1 mg/dL (ref 0.70–1.30)
Globulin: 2.8 g/dL (calc) (ref 1.9–3.7)
Glucose, Bld: 92 mg/dL (ref 65–139)
Potassium: 4.2 mmol/L (ref 3.5–5.3)
Sodium: 138 mmol/L (ref 135–146)
Total Bilirubin: 0.6 mg/dL (ref 0.2–1.2)
Total Protein: 7.3 g/dL (ref 6.1–8.1)
eGFR: 78 mL/min/{1.73_m2} (ref >=60)

## 2022-02-18 LAB — LIPID PANEL W/REFLEX DIRECT LDL
Cholesterol: 197 mg/dL (ref <200)
HDL: 47 mg/dL (ref >=40)
LDL Cholesterol (Calc): 121 mg/dL (calc) — ABNORMAL HIGH
Non-HDL Cholesterol (Calc): 150 mg/dL (calc) — ABNORMAL HIGH (ref <130)
Total CHOL/HDL Ratio: 4.2 (calc) (ref <5.0)
Triglycerides: 169 mg/dL — ABNORMAL HIGH (ref <150)

## 2022-02-18 LAB — TSH: TSH: 1.03 mIU/L (ref 0.40–4.50)

## 2022-02-18 LAB — PSA: PSA: 1.02 ng/mL (ref <=4.00)

## 2022-02-18 NOTE — Progress Notes (Signed)
Kidney, liver, glucose look great! ?Normal hemoglobin.  ?Thyroid looks great.  ?PSA normal range.  ?Your LDL is up some but HDL is good. I did calculate your 10 year risk and it is 9 percent. Anything above 7.5 we suggest a statin to lower cholesterol and risk. I would suggest this. Are you ok with me sending?  ?

## 2022-03-27 ENCOUNTER — Other Ambulatory Visit: Payer: Self-pay | Admitting: Physician Assistant

## 2022-03-27 DIAGNOSIS — F40243 Fear of flying: Secondary | ICD-10-CM | POA: Insufficient documentation

## 2022-03-27 MED ORDER — CLONAZEPAM 0.5 MG PO TABS: ORAL_TABLET | ORAL | 0 refills | Status: AC

## 2022-03-27 NOTE — Progress Notes (Signed)
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 ? ?[name] Kitchen.Diagnoses and all orders for this visit: ? ?Anxiety with flying ? ?Other orders ?-     clonazePAM (KLONOPIN) 0.5 MG tablet; Take 1-2 tablets 30 minutes before flying for anxiety. ? ? ? ?

## 2022-06-15 ENCOUNTER — Telehealth: Payer: Self-pay | Admitting: Physician Assistant

## 2022-06-15 DIAGNOSIS — J40 Bronchitis, not specified as acute or chronic: Secondary | ICD-10-CM

## 2022-06-15 MED ORDER — ALBUTEROL SULFATE HFA 108 (90 BASE) MCG/ACT IN AERS: 2.0000 | INHALATION_SPRAY | Freq: Four times a day (QID) | RESPIRATORY_TRACT | 0 refills | Status: DC | PRN

## 2022-06-15 MED ORDER — PREDNISONE 50 MG PO TABS: ORAL_TABLET | ORAL | 0 refills | Status: DC

## 2022-06-15 NOTE — Telephone Encounter (Signed)
Pt called 2 days ago with c/o cough for a week with wheezing. No fever, chills, body aches. Remaining active. Taking mucinex and OTC cough syrups. No difficulty breathing. Cough is productive but clear.   Sent prednisone and albuterol inhaler for bronchitis.

## 2022-08-14 ENCOUNTER — Emergency Department (HOSPITAL_BASED_OUTPATIENT_CLINIC_OR_DEPARTMENT_OTHER): Payer: 59

## 2022-08-14 ENCOUNTER — Emergency Department (HOSPITAL_BASED_OUTPATIENT_CLINIC_OR_DEPARTMENT_OTHER)
Admission: EM | Admit: 2022-08-14 | Discharge: 2022-08-14 | Disposition: A | Discharge status: Home / Self Care | Payer: 59 | Attending: Emergency Medicine | Admitting: Emergency Medicine

## 2022-08-14 ENCOUNTER — Encounter (HOSPITAL_BASED_OUTPATIENT_CLINIC_OR_DEPARTMENT_OTHER): Payer: Self-pay | Admitting: Urology

## 2022-08-14 DIAGNOSIS — R066 Hiccough: Secondary | ICD-10-CM | POA: Diagnosis not present

## 2022-08-14 DIAGNOSIS — N179 Acute kidney failure, unspecified: Secondary | ICD-10-CM

## 2022-08-14 DIAGNOSIS — R1013 Epigastric pain: Secondary | ICD-10-CM | POA: Insufficient documentation

## 2022-08-14 DIAGNOSIS — N200 Calculus of kidney: Secondary | ICD-10-CM | POA: Diagnosis not present

## 2022-08-14 DIAGNOSIS — Z79899 Other long term (current) drug therapy: Secondary | ICD-10-CM | POA: Insufficient documentation

## 2022-08-14 DIAGNOSIS — I10 Essential (primary) hypertension: Secondary | ICD-10-CM | POA: Diagnosis not present

## 2022-08-14 DIAGNOSIS — K5730 Diverticulosis of large intestine without perforation or abscess without bleeding: Secondary | ICD-10-CM | POA: Diagnosis not present

## 2022-08-14 LAB — CBC WITH DIFFERENTIAL/PLATELET
Abs Immature Granulocytes: 0.02 10*3/uL (ref 0.00–0.07)
Basophils Absolute: 0.1 10*3/uL (ref 0.0–0.1)
Basophils Relative: 1 %
Eosinophils Absolute: 0 10*3/uL (ref 0.0–0.5)
Eosinophils Relative: 1 %
HCT: 38.9 % — ABNORMAL LOW (ref 39.0–52.0)
Hemoglobin: 13.9 g/dL (ref 13.0–17.0)
Immature Granulocytes: 0 %
Lymphocytes Relative: 29 %
Lymphs Abs: 1.9 10*3/uL (ref 0.7–4.0)
MCH: 31.7 pg (ref 26.0–34.0)
MCHC: 35.7 g/dL (ref 30.0–36.0)
MCV: 88.6 fL (ref 80.0–100.0)
Monocytes Absolute: 0.7 10*3/uL (ref 0.1–1.0)
Monocytes Relative: 10 %
Neutro Abs: 3.9 10*3/uL (ref 1.7–7.7)
Neutrophils Relative %: 59 %
Platelets: 173 10*3/uL (ref 150–400)
RBC: 4.39 MIL/uL (ref 4.22–5.81)
RDW: 13.6 % (ref 11.5–15.5)
WBC: 6.5 10*3/uL (ref 4.0–10.5)
nRBC: 0 % (ref 0.0–0.2)

## 2022-08-14 LAB — URINALYSIS, ROUTINE W REFLEX MICROSCOPIC
Bilirubin Urine: NEGATIVE
Glucose, UA: NEGATIVE mg/dL
Hgb urine dipstick: NEGATIVE
Ketones, ur: NEGATIVE mg/dL
Leukocytes,Ua: NEGATIVE
Nitrite: NEGATIVE
Protein, ur: NEGATIVE mg/dL
Specific Gravity, Urine: 1.015 (ref 1.005–1.030)
pH: 5 (ref 5.0–8.0)

## 2022-08-14 LAB — LIPASE, BLOOD: Lipase: 38 U/L (ref 11–51)

## 2022-08-14 LAB — BASIC METABOLIC PANEL
Anion gap: 7 (ref 5–15)
BUN: 33 mg/dL — ABNORMAL HIGH (ref 6–20)
CO2: 24 mmol/L (ref 22–32)
Calcium: 9.5 mg/dL (ref 8.9–10.3)
Chloride: 104 mmol/L (ref 98–111)
Creatinine, Ser: 2.27 mg/dL — ABNORMAL HIGH (ref 0.61–1.24)
GFR, Estimated: 33 mL/min — ABNORMAL LOW (ref >60)
Glucose, Bld: 82 mg/dL (ref 70–99)
Potassium: 4.3 mmol/L (ref 3.5–5.1)
Sodium: 135 mmol/L (ref 135–145)

## 2022-08-14 LAB — TROPONIN I (HIGH SENSITIVITY): Troponin I (High Sensitivity): 3 ng/L (ref <18)

## 2022-08-14 LAB — CREATININE, SERUM
Creatinine, Ser: 1.95 mg/dL — ABNORMAL HIGH (ref 0.61–1.24)
GFR, Estimated: 39 mL/min — ABNORMAL LOW (ref >60)

## 2022-08-14 MED ORDER — LACTATED RINGERS IV BOLUS
1000.0000 mL | Freq: Once | INTRAVENOUS | Status: AC
  Administered 2022-08-14: 1000 mL via INTRAVENOUS

## 2022-08-14 NOTE — Discharge Instructions (Signed)
We evaluated you for your hiccups.  Your lab tests were all reassuring with the exception of your kidney function which was abnormal.  It improved with fluids but is [place] high.  You need to follow-up closely with your primary doctor for recheck.  Please try to drink a lot of fluids over the weekend before you see her.  Please return to the emergency department if you have any new symptoms such as severe pain, vomiting, fevers, lightheadedness, fainting, or any other new symptoms.

## 2022-08-14 NOTE — ED Notes (Signed)
Patient transported to CT 

## 2022-08-14 NOTE — ED Triage Notes (Signed)
Pt states hiccups x 3 days, stopped once walking in the door today  States burping associated with hiccups  Denies any swallowing trouble, decreased appetite, complains of epigastric pressure

## 2022-08-14 NOTE — ED Provider Notes (Signed)
[facility] EMERGENCY DEPARTMENT Provider Note  CSN: [id number] Arrival date & time: 08/14/22 3825  Chief Complaint(s) Hiccups  HPI Mason Rangel is a 56 y.o. male with history of hypertension, LVH presenting to the emergency department with hiccups.  Patient reports hiccups for 3 days.  They stopped when he arrived in the emergency department.  Reports very mild epigastric pressure, otherwise denies any associated symptoms such as nausea, vomiting, chest pain, fevers, chills, abdominal pain.  Reports has been urinating more frequently.  Otherwise feels well.   Past Medical History Past Medical History:  Diagnosis Date  . Anxiety   . Gout   . Hypertension    Patient Active Problem List   Diagnosis Date Noted  . 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 Elevated LDL cholesterol level 02/18/2022  . Gastroesophageal reflux disease with esophagitis 09/28/2019  . Hypertensive crisis 09/28/2019  . Rupture of left distal biceps tendon 06/22/2019  . Palpitations 08/12/2018  . Dizziness 08/09/2018  . 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 Low back pain 08/09/2018  . 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 Syncope 08/09/2018  . SVT (supraventricular tachycardia) (HCC) 07/18/2018  . Muscle spasm of back 07/18/2018  . Plantar fasciitis 12/28/2017  . Diastolic dysfunction 04/28/2017  . LVH (left ventricular hypertrophy) due to hypertensive disease, without heart failure 03/24/2017  . Hypogonadism in male 10/19/2016  . Anxiety 04/06/2016  . Insomnia 04/06/2016  . Adjustment disorder with mixed anxiety and depressed mood 04/06/2016  . Essential hypertension, benign 04/06/2016  . GERD (gastroesophageal reflux disease) 04/06/2016  . Esophageal stenosis 04/06/2016  . Thrombosed external hemorrhoid 11/20/2014   Home Medication(s) Prior to Admission medications   Medication Sig Start Date End Date Taking? Authorizing Provider   albuterol (VENTOLIN HFA) 108 (90 Base) MCG/ACT inhaler Inhale 2 puffs into the lungs every 6 (six) hours as needed. 06/15/22   Breeback, Jade L, PA-C  amLODipine (NORVASC) 10 MG tablet Take 1 tablet (10 mg total) by mouth daily. 02/17/22   Breeback, Jade L, PA-C  clonazePAM (KLONOPIN) 0.5 MG tablet Take 1-2 tablets 30 minutes before flying for anxiety. 03/27/22   Breeback, Lonna Cobb, PA-C  cyclobenzaprine (FLEXERIL) 5 MG tablet Take 1 tablet (5 mg total) by mouth 3 (three) times daily as needed for muscle spasms. 02/02/22   Breeback, Lonna Cobb, PA-C  esomeprazole (NEXIUM) 40 MG capsule Take 1 capsule (40 mg total) by mouth daily. 08/19/21   Breeback, Lonna Cobb, PA-C  olmesartan-hydrochlorothiazide (BENICAR HCT) 40-12.5 MG tablet Take 1 tablet by mouth daily. 02/17/22   Breeback, Jade L, PA-C  predniSONE (DELTASONE) 50 MG tablet Take one tablet for 5 days. 06/15/22   Breeback, Jade L, PA-C  Tadalafil 2.5 MG TABS TAKE 1 TABLET BY MOUTH ONCE DAILY FOR 90 DAYS 03/08/20   [provider]  testosterone cypionate (DEPOTESTOTERONE CYPIONATE) 100 MG/ML injection Inject into the muscle every 7 (seven) days. For IM use only    [provider]  traZODone (DESYREL) 50 MG tablet TAKE 1 TO 2 TABLETS BY MOUTH AT BEDTIME AS NEEDED . 02/17/22   Breeback, Jade L, PA-C  valACYclovir (VALTREX) 1000 MG tablet Take 1 tablet (1,000 mg total) by mouth daily. 02/17/22   Breeback, Jade L, PA-C  zolpidem (AMBIEN) 10 MG tablet Take 1 tablet (10 mg total) by mouth at bedtime. 02/17/22   Jomarie Longs, PA-C  Past Surgical History Past Surgical History:  Procedure Laterality Date  . ESOPHAGEAL DILATION    . upper endoscopy with dilation  2009   Family History Family History  Problem Relation Age of Onset  . Hypertension Mother   . ALS Father   . Stroke Maternal Grandfather   . Heart attack  Paternal Grandmother   . Heart attack Paternal Grandfather     Social History Social History   Tobacco Use  . Smoking status: Never  . Smokeless tobacco: Never  Substance Use Topics  . Alcohol use: Yes    Alcohol/week: 2.0 standard drinks of alcohol    Types: 2 Glasses of wine per week    Comment: occ  . Drug use: No   Allergies Morphine, Lorazepam, and Morphine and related  Review of Systems Review of Systems  All other systems reviewed and are negative.   Physical Exam Vital Signs  I have reviewed the triage vital signs BP (!) 124/94   Pulse 68   Temp 98.1 F (36.7 C) (Oral)   Resp 19   Ht 6' (1.829 m)   Wt 97.1 kg   SpO2 99%   BMI 29.02 kg/m  Physical Exam Vitals and nursing note reviewed.  Constitutional:      General: He is not in acute distress.    Appearance: Normal appearance.  HENT:     Mouth/Throat:     Mouth: Mucous membranes are moist.  Eyes:     Conjunctiva/sclera: Conjunctivae normal.  Cardiovascular:     Rate and Rhythm: Normal rate and regular rhythm.  Pulmonary:     Effort: Pulmonary effort is normal. No respiratory distress.     Breath sounds: Normal breath sounds.  Abdominal:     General: Abdomen is flat.     Palpations: Abdomen is soft.     Tenderness: There is no abdominal tenderness.  Musculoskeletal:     Right lower leg: No edema.     Left lower leg: No edema.  Skin:    General: Skin is warm and dry.     Capillary Refill: Capillary refill takes less than 2 seconds.  Neurological:     Mental Status: He is alert and oriented to person, place, and time. Mental status is at baseline.  Psychiatric:        Mood and Affect: Mood normal.        Behavior: Behavior normal.     ED Results and Treatments Labs (all labs ordered are listed, but only abnormal results are displayed) Labs Reviewed  BASIC METABOLIC PANEL - Abnormal; Notable for the following components:      Result Value   BUN 33 (*)    Creatinine, Ser 2.27 (*)     GFR, Estimated 33 (*)    All other components within normal limits  CBC WITH DIFFERENTIAL/PLATELET - Abnormal; Notable for the following components:   HCT 38.9 (*)    All other components within normal limits  CREATININE, SERUM - Abnormal; Notable for the following components:   Creatinine, Ser 1.95 (*)    GFR, Estimated 39 (*)    All other components within normal limits  LIPASE, BLOOD  URINALYSIS, ROUTINE W REFLEX MICROSCOPIC  TROPONIN I (HIGH SENSITIVITY)  Radiology CT Abdomen Pelvis Wo Contrast  Result Date: 08/14/2022 CLINICAL DATA:  Epigastric pressure and decreased appetite. EXAM: CT ABDOMEN AND PELVIS WITHOUT CONTRAST TECHNIQUE: Multidetector CT imaging of the abdomen and pelvis was performed following the standard protocol without IV contrast. RADIATION DOSE REDUCTION: This exam was performed according to the departmental dose-optimization program which includes automated exposure control, adjustment of the mA and/or kV according to patient size and/or use of iterative reconstruction technique. COMPARISON:  CT abdomen and pelvis 07/17/2018 FINDINGS: Lower chest: No acute abnormality. Hepatobiliary: 3.2 x 3.2 cm cyst in the inferior right lobe appears unchanged. No new liver lesions are seen. Gallbladder and bile ducts are within normal limits. Pancreas: Unremarkable. No pancreatic ductal dilatation or surrounding inflammatory changes. Spleen: Normal in size without focal abnormality. Adrenals/Urinary Tract: There is a punctate calcification in the right kidney. No hydronephrosis or perinephric fat stranding. Left kidney, adrenal glands and bladder are within normal limits. Bladder is within normal limits. Stomach/Bowel: Stomach is within normal limits. Appendix appears normal. No evidence of bowel wall thickening, distention, or inflammatory changes. There is sigmoid colon  diverticulosis. Vascular/Lymphatic: No significant vascular findings are present. No enlarged abdominal or pelvic lymph nodes. Reproductive: Prostate is unremarkable. Other: There are small fat containing umbilical and inguinal hernias. No ascites. Musculoskeletal: No acute or significant osseous findings. IMPRESSION: 1. No acute process in the abdomen or pelvis. 2. Nonobstructing right renal calculus. 3. Colonic diverticulosis. Electronically Signed   By: Darliss Cheney M.D.   On: 08/14/2022 22:12   DG Chest 1 View  Result Date: 08/14/2022 CLINICAL DATA:  Epigastric pain. EXAM: CHEST  1 VIEW COMPARISON:  Chest x-ray 04/25/2017 FINDINGS: The heart size and mediastinal contours are within normal limits. Both lungs are clear. The visualized skeletal structures are unremarkable. IMPRESSION: No active disease. Electronically Signed   By: Darliss Cheney M.D.   On: 08/14/2022 19:34    Pertinent labs & imaging results that were available during my care of the patient were reviewed by me and considered in my medical decision making (see MDM for details).  Medications Ordered in ED Medications  lactated ringers bolus 1,000 mL (0 mLs Intravenous Stopped 08/14/22 2236)                                                                                                                                     Procedures Procedures  (including critical care time)  Medical Decision Making / ED Course   MDM:  56 year old male presenting to the emergency department with hiccups.  Patient well-appearing, EKG reassuring without acute ST or T wave changes, physical exam overall unremarkable.  Given epigastric pain, obtain basic labs including creatinine, troponin.  Labs overall reassuring other than elevated creatinine of 2.27, significantly increased from baseline.  No flank pain, but does report increased urination recently.  Will obtain CT scan to further evaluate.  Otherwise, doubt dangerous cause of hiccups such as  pneumonia, with clear lungs and normal chest x-ray, PE with no chest pain, tachycardia, shortness of breath or pleuritic pain, dissection with no chest pain or severe pain, gradual onset.  Will reassess.  Will give IV fluids and recheck creatinine.  Clinical Course as of 08/15/22 1055  Fri Aug 14, 2022  2300 Creatinine improved with IV fluids.  CT abdomen without evidence of hydronephrosis or obstruction.  Discussed results with the patient.  He reports he will see his primary doctor on Monday for recheck of creatinine.  Encouraged copious fluid intake and strict return precautions in the interim. Will discharge patient to home. All questions answered. Patient comfortable with plan of discharge. Return precautions discussed with patient and specified on the after visit summary.  [WS]    Clinical Course User Index [WS] Lonell Grandchild, MD     Additional history obtained: -External records from outside source obtained and reviewed including: Chart review including previous notes, labs, imaging, consultation notes   Lab Tests: -I ordered, reviewed, and interpreted labs.   The pertinent results include:   Labs Reviewed  BASIC METABOLIC PANEL - Abnormal; Notable for the following components:      Result Value   BUN 33 (*)    Creatinine, Ser 2.27 (*)    GFR, Estimated 33 (*)    All other components within normal limits  CBC WITH DIFFERENTIAL/PLATELET - Abnormal; Notable for the following components:   HCT 38.9 (*)    All other components within normal limits  CREATININE, SERUM - Abnormal; Notable for the following components:   Creatinine, Ser 1.95 (*)    GFR, Estimated 39 (*)    All other components within normal limits  LIPASE, BLOOD  URINALYSIS, ROUTINE W REFLEX MICROSCOPIC  TROPONIN I (HIGH SENSITIVITY)      EKG   EKG Interpretation  Date/Time:  Friday August 14 2022 19:00:03 EDT Ventricular Rate:  84 PR Interval:  167 QRS Duration: 101 QT Interval:  354 QTC  Calculation: 419 R Axis:   -58 Text Interpretation: Sinus rhythm Left anterior fascicular block Confirmed by Alvino Blood ([id number]) on 08/14/2022 8:02:35 PM         Imaging Studies ordered: I ordered imaging studies including CT A/P On my interpretation imaging demonstrates no acute process I independently visualized and interpreted imaging. I agree with the radiologist interpretation   Medicines ordered and prescription drug management: Meds ordered this encounter  Medications  . lactated ringers bolus 1,000 mL    -I have reviewed the patients home medicines and have made adjustments as needed  Reevaluation: After the interventions noted above, I reevaluated the patient and found that they have resolved  Co morbidities that complicate the patient evaluation . Past Medical History:  Diagnosis Date  . Anxiety   . Gout   . Hypertension       Dispostion: Discharge    Final Clinical Impression(s) / ED Diagnoses Final diagnoses:  Acute kidney injury (HCC)  Hiccups     This chart was dictated using voice recognition software.  Despite best efforts to proofread,  errors can occur which can change the documentation meaning.    Lonell Grandchild, MD 08/15/22 1055

## 2022-08-17 ENCOUNTER — Ambulatory Visit (INDEPENDENT_AMBULATORY_CARE_PROVIDER_SITE_OTHER): Payer: 59 | Admitting: Physician Assistant

## 2022-08-17 ENCOUNTER — Other Ambulatory Visit: Payer: Self-pay | Admitting: Physician Assistant

## 2022-08-17 ENCOUNTER — Encounter: Payer: Self-pay | Admitting: Physician Assistant

## 2022-08-17 VITALS — BP 103/75 | HR 94 | Ht 72.0 in | Wt 216.0 lb

## 2022-08-17 DIAGNOSIS — R142 Eructation: Secondary | ICD-10-CM | POA: Insufficient documentation

## 2022-08-17 DIAGNOSIS — N179 Acute kidney failure, unspecified: Secondary | ICD-10-CM | POA: Insufficient documentation

## 2022-08-17 DIAGNOSIS — R635 Abnormal weight gain: Secondary | ICD-10-CM | POA: Diagnosis not present

## 2022-08-17 DIAGNOSIS — K219 Gastro-esophageal reflux disease without esophagitis: Secondary | ICD-10-CM

## 2022-08-17 DIAGNOSIS — R1013 Epigastric pain: Secondary | ICD-10-CM | POA: Diagnosis not present

## 2022-08-17 DIAGNOSIS — R066 Hiccough: Secondary | ICD-10-CM | POA: Diagnosis not present

## 2022-08-17 DIAGNOSIS — R5383 Other fatigue: Secondary | ICD-10-CM

## 2022-08-17 DIAGNOSIS — I959 Hypotension, unspecified: Secondary | ICD-10-CM | POA: Insufficient documentation

## 2022-08-17 MED ORDER — SUCRALFATE 1 G PO TABS: 1.0000 g | ORAL_TABLET | Freq: Four times a day (QID) | ORAL | 0 refills | Status: DC

## 2022-08-17 MED ORDER — DEXLANSOPRAZOLE 60 MG PO CPDR: 60.0000 mg | DELAYED_RELEASE_CAPSULE | Freq: Every day | ORAL | 0 refills | Status: DC

## 2022-08-17 NOTE — Telephone Encounter (Signed)
Please advise on change?

## 2022-08-17 NOTE — Patient Instructions (Addendum)
Need for follow up with gastroenterologist.

## 2022-08-17 NOTE — Progress Notes (Signed)
Established Patient Office Visit  Subjective   Patient ID: Mason Rangel, male    DOB: Jun 22, 1966  Age: 56 y.o. MRN: [id number]  Chief Complaint  Patient presents with   Hospitalization Follow-up    HPI Pt is a 56 yo male with history of HTN, LVH, GERD who is here to follow up from ED on 08/14/2022 for hiccups for 3 days. Hiccups actually stopped when go to ED.   His work up found him to be in AKI with CR of 2.27 and ALT of 38. He was given fluids and Cr went down to 1.95. Ct of abdomen was negative for any acute findings. EKG reassuring.   He does mention he is getting mounjaro and phentermine through [employer]. He has had more indigestion/burping/hiccipus since the increased dose of mounjaro.   Not taking NSAIds and drinking 1-2 drinks a month.   He is very tired today and just wants to get everything checked out.   ROS   See HPI.  Objective:     BP 103/75   Pulse 94   Ht 6' (1.829 m)   Wt 216 lb (98 kg)   SpO2 99%   BMI 29.29 kg/m  BP Readings from Last 3 Encounters:  08/17/22 103/75  08/14/22 (!) 124/94  02/17/22 137/88   Wt Readings from Last 3 Encounters:  08/17/22 216 lb (98 kg)  08/14/22 214 lb (97.1 kg)  02/17/22 229 lb (103.9 kg)      Physical Exam Constitutional:      Appearance: Normal appearance.  HENT:     Head: Normocephalic.  Cardiovascular:     Rate and Rhythm: Normal rate and regular rhythm.  Pulmonary:     Effort: Pulmonary effort is normal.     Breath sounds: Normal breath sounds.  Abdominal:     General: There is no distension.     Palpations: Abdomen is soft. There is no mass.     Tenderness: There is no abdominal tenderness. There is no right CVA tenderness, left CVA tenderness, guarding or rebound.     Hernia: No hernia is present.  Neurological:     General: No focal deficit present.     Mental Status: He is alert and oriented to person, place, and time.  Psychiatric:        Mood and Affect: Mood normal.          Assessment & Plan:  [name] KitchenMarland KitchenGeronimo was seen today for hospitalization follow-up.  Diagnoses and all orders for this visit:  Hiccups -     Ambulatory referral to Gastroenterology  AKI (acute kidney injury) (HCC) -     COMPLETE METABOLIC PANEL WITH GFR -     Lipase -     CBC w/Diff/Platelet  Epigastric pain -     dexlansoprazole (DEXILANT) 60 MG capsule; Take 1 capsule (60 mg total) by mouth daily. -     sucralfate (CARAFATE) 1 g tablet; Take 1 tablet (1 g total) by mouth 4 (four) times daily. -     COMPLETE METABOLIC PANEL WITH GFR -     Lipase -     CBC w/Diff/Platelet  Burping -     dexlansoprazole (DEXILANT) 60 MG capsule; Take 1 capsule (60 mg total) by mouth daily. -     sucralfate (CARAFATE) 1 g tablet; Take 1 tablet (1 g total) by mouth 4 (four) times daily. -     COMPLETE METABOLIC PANEL WITH GFR -     Lipase -  CBC w/Diff/Platelet -     Ambulatory referral to Gastroenterology  No energy  Gastroesophageal reflux disease, unspecified whether esophagitis present -     dexlansoprazole (DEXILANT) 60 MG capsule; Take 1 capsule (60 mg total) by mouth daily. -     sucralfate (CARAFATE) 1 g tablet; Take 1 tablet (1 g total) by mouth 4 (four) times daily. -     Ambulatory referral to Gastroenterology  Abnormal weight gain  Hypotension, unspecified hypotension type   Certainly SE of mounjaro is epigastric symptoms and worsening indigestion Decrease back down to the lower dose Stop nexium  Start dexilant Needs local GI to consider endoscopy, will place referral Carafate to use before meals  Recheck kidney function Keep pushing fluids Limit protein shakes right now Hold phentermine if making you not drink  BP low today Keep monitoring Cut norvasc in half  Follow up in 1 month    Tandy Gaw, PA-C

## 2022-08-18 ENCOUNTER — Other Ambulatory Visit: Payer: Self-pay | Admitting: Neurology

## 2022-08-18 DIAGNOSIS — N289 Disorder of kidney and ureter, unspecified: Secondary | ICD-10-CM

## 2022-08-18 LAB — CBC WITH DIFFERENTIAL/PLATELET
Absolute Monocytes: 495 cells/uL (ref 200–950)
Basophils Absolute: 29 cells/uL (ref 0–200)
Basophils Relative: 0.6 %
Eosinophils Absolute: 69 cells/uL (ref 15–500)
Eosinophils Relative: 1.4 %
HCT: 43.7 % (ref 38.5–50.0)
Hemoglobin: 15.2 g/dL (ref 13.2–17.1)
Lymphs Abs: 1284 cells/uL (ref 850–3900)
MCH: 32.3 pg (ref 27.0–33.0)
MCHC: 34.8 g/dL (ref 32.0–36.0)
MCV: 92.8 fL (ref 80.0–100.0)
MPV: 11 fL (ref 7.5–12.5)
Monocytes Relative: 10.1 %
Neutro Abs: 3023 cells/uL (ref 1500–7800)
Neutrophils Relative %: 61.7 %
Platelets: 153 10*3/uL (ref 140–400)
RBC: 4.71 10*6/uL (ref 4.20–5.80)
RDW: 14 % (ref 11.0–15.0)
Total Lymphocyte: 26.2 %
WBC: 4.9 10*3/uL (ref 3.8–10.8)

## 2022-08-18 LAB — COMPLETE METABOLIC PANEL WITH GFR
AG Ratio: 1.5 (calc) (ref 1.0–2.5)
ALT: 18 U/L (ref 9–46)
AST: 25 U/L (ref 10–35)
Albumin: 4.4 g/dL (ref 3.6–5.1)
Alkaline phosphatase (APISO): 56 U/L (ref 35–144)
BUN/Creatinine Ratio: 14 (calc) (ref 6–22)
BUN: 25 mg/dL (ref 7–25)
CO2: 25 mmol/L (ref 20–32)
Calcium: 9.9 mg/dL (ref 8.6–10.3)
Chloride: 101 mmol/L (ref 98–110)
Creat: 1.82 mg/dL — ABNORMAL HIGH (ref 0.70–1.30)
Globulin: 2.9 g/dL (calc) (ref 1.9–3.7)
Glucose, Bld: 87 mg/dL (ref 65–99)
Potassium: 4.8 mmol/L (ref 3.5–5.3)
Sodium: 134 mmol/L — ABNORMAL LOW (ref 135–146)
Total Bilirubin: 0.6 mg/dL (ref 0.2–1.2)
Total Protein: 7.3 g/dL (ref 6.1–8.1)
eGFR: 43 mL/min/{1.73_m2} — ABNORMAL LOW (ref >=60)

## 2022-08-18 LAB — LIPASE: Lipase: 42 U/L (ref 7–60)

## 2022-08-18 NOTE — Progress Notes (Signed)
Mason Rangel,   Pancreatic enzymes are normal.  BUN [place] on the elevated side and kidney function better but not to goal or baseline. I would stop adipex. Continue to push fluids with electrolytes and recheck in one week.

## 2022-08-21 ENCOUNTER — Telehealth: Payer: Self-pay | Admitting: Physician Assistant

## 2022-08-21 ENCOUNTER — Ambulatory Visit: Payer: 59 | Admitting: Podiatry

## 2022-08-21 ENCOUNTER — Ambulatory Visit (INDEPENDENT_AMBULATORY_CARE_PROVIDER_SITE_OTHER): Payer: 59

## 2022-08-21 DIAGNOSIS — M722 Plantar fascial fibromatosis: Secondary | ICD-10-CM

## 2022-08-21 DIAGNOSIS — R066 Hiccough: Secondary | ICD-10-CM

## 2022-08-21 DIAGNOSIS — K219 Gastro-esophageal reflux disease without esophagitis: Secondary | ICD-10-CM

## 2022-08-21 DIAGNOSIS — R1013 Epigastric pain: Secondary | ICD-10-CM

## 2022-08-21 DIAGNOSIS — R142 Eructation: Secondary | ICD-10-CM

## 2022-08-21 MED ORDER — TRIAMCINOLONE ACETONIDE 10 MG/ML IJ SUSP
10.0000 mg | Freq: Once | INTRAMUSCULAR | Status: AC
  Administered 2022-08-21: 10 mg

## 2022-08-21 MED ORDER — DEXLANSOPRAZOLE 60 MG PO CPDR: 60.0000 mg | DELAYED_RELEASE_CAPSULE | Freq: Every day | ORAL | 3 refills | Status: DC

## 2022-08-21 NOTE — Patient Instructions (Signed)
For instructions on how to put on your Plantar Fascial Brace, please visit [email or web address]  --  While at your visit today you received a steroid injection in your foot or ankle to help with your pain. Along with having the steroid medication there is some "numbing" medication in the shot that you received. Due to this you may notice some numbness to the area for the next couple of hours.   I would recommend limiting activity for the next few days to help the steroid injection take affect.    The actually benefit from the steroid injection may take up to 2-7 days to see a difference. You may actually experience a small (as in 10%) INCREASE in pain in the first 24 hours---that is common. It would be best if you can ice the area today and take anti-inflammatory medications (such as Ibuprofen, Motrin, or Aleve) if you are able to take these medications. If you were prescribed another medication to help with the pain go ahead and start that medication today    Things to watch out for that you should contact us or a health care provider urgently would include: 1. Unusual (as in more than 10%) increase in pain 2. New fever > 101.5 3. New swelling or redness of the injected area.  4. Streaking of red lines around the area injected.  If you have any questions or concerns about this, please give our office a call at [phone].    ----   Plantar Fasciitis (Heel Spur Syndrome) with Rehab The plantar fascia is a fibrous, ligament-like, soft-tissue structure that spans the bottom of the foot. Plantar fasciitis is a condition that causes pain in the foot due to inflammation of the tissue. SYMPTOMS  Pain and tenderness on the underneath side of the foot. Pain that worsens with standing or walking. CAUSES  Plantar fasciitis is caused by irritation and injury to the plantar fascia on the underneath side of the foot. Common mechanisms of injury include: Direct trauma to bottom of the  foot. Damage to a small nerve that runs under the foot where the main fascia attaches to the heel bone. Stress placed on the plantar fascia due to bone spurs. RISK INCREASES WITH:  Activities that place stress on the plantar fascia (running, jumping, pivoting, or cutting). Poor strength and flexibility. Improperly fitted shoes. Tight calf muscles. Flat feet. Failure to warm-up properly before activity. Obesity. PREVENTION Warm up and stretch properly before activity. Allow for adequate recovery between workouts. Maintain physical fitness: Strength, flexibility, and endurance. Cardiovascular fitness. Maintain a health body weight. Avoid stress on the plantar fascia. Wear properly fitted shoes, including arch supports for individuals who have flat feet.  PROGNOSIS  If treated properly, then the symptoms of plantar fasciitis usually resolve without surgery. However, occasionally surgery is necessary.  RELATED COMPLICATIONS  Recurrent symptoms that may result in a chronic condition. Problems of the lower back that are caused by compensating for the injury, such as limping. Pain or weakness of the foot during push-off following surgery. Chronic inflammation, scarring, and partial or complete fascia tear, occurring more often from repeated injections.  TREATMENT  Treatment initially involves the use of ice and medication to help reduce pain and inflammation. The use of strengthening and stretching exercises may help reduce pain with activity, especially stretches of the Achilles tendon. These exercises may be performed at home or with a therapist. Your caregiver may recommend that you use heel cups of arch supports to help reduce stress on   the plantar fascia. Occasionally, corticosteroid injections are given to reduce inflammation. If symptoms persist for greater than 6 months despite non-surgical (conservative), then surgery may be recommended.   MEDICATION  If pain medication is  necessary, then nonsteroidal anti-inflammatory medications, such as aspirin and ibuprofen, or other minor pain relievers, such as acetaminophen, are often recommended. Do not take pain medication within 7 days before surgery. Prescription pain relievers may be given if deemed necessary by your caregiver. Use only as directed and only as much as you need. Corticosteroid injections may be given by your caregiver. These injections should be reserved for the most serious cases, because they may only be given a certain number of times.  HEAT AND COLD Cold treatment (icing) relieves pain and reduces inflammation. Cold treatment should be applied for 10 to 15 minutes every 2 to 3 hours for inflammation and pain and immediately after any activity that aggravates your symptoms. Use ice packs or massage the area with a piece of ice (ice massage). Heat treatment may be used prior to performing the stretching and strengthening activities prescribed by your caregiver, physical therapist, or athletic trainer. Use a heat pack or soak the injury in warm water.  SEEK IMMEDIATE MEDICAL CARE IF: Treatment seems to offer no benefit, or the condition worsens. Any medications produce adverse side effects.  EXERCISES- RANGE OF MOTION (ROM) AND STRETCHING EXERCISES - Plantar Fasciitis (Heel Spur Syndrome) These exercises may help you when beginning to rehabilitate your injury. Your symptoms may resolve with or without further involvement from your physician, physical therapist or athletic trainer. While completing these exercises, remember:  Restoring tissue flexibility helps normal motion to return to the joints. This allows healthier, less painful movement and activity. An effective stretch should be held for at least 30 seconds. A stretch should never be painful. You should only feel a gentle lengthening or release in the stretched tissue.  RANGE OF MOTION - Toe Extension, Flexion Sit with your right / left leg  crossed over your opposite knee. Grasp your toes and gently pull them back toward the top of your foot. You should feel a stretch on the bottom of your toes and/or foot. Hold this stretch for 10 seconds. Now, gently pull your toes toward the bottom of your foot. You should feel a stretch on the top of your toes and or foot. Hold this stretch for 10 seconds. Repeat  times. Complete this stretch 3 times per day.   RANGE OF MOTION - Ankle Dorsiflexion, Active Assisted Remove shoes and sit on a chair that is preferably not on a carpeted surface. Place right / left foot under knee. Extend your opposite leg for support. Keeping your heel down, slide your right / left foot back toward the chair until you feel a stretch at your ankle or calf. If you do not feel a stretch, slide your bottom forward to the edge of the chair, while Mason Rangel keeping your heel down. Hold this stretch for 10 seconds. Repeat 3 times. Complete this stretch 2 times per day.   STRETCH  Gastroc, Standing Place hands on wall. Extend right / left leg, keeping the front knee somewhat bent. Slightly point your toes inward on your back foot. Keeping your right / left heel on the floor and your knee straight, shift your weight toward the wall, not allowing your back to arch. You should feel a gentle stretch in the right / left calf. Hold this position for 10 seconds. Repeat 3 times. Complete this   stretch 2 times per day.  STRETCH  Soleus, Standing Place hands on wall. Extend right / left leg, keeping the other knee somewhat bent. Slightly point your toes inward on your back foot. Keep your right / left heel on the floor, bend your back knee, and slightly shift your weight over the back leg so that you feel a gentle stretch deep in your back calf. Hold this position for 10 seconds. Repeat 3 times. Complete this stretch 2 times per day.  STRETCH  Gastrocsoleus, Standing  Note: This exercise can place a lot of stress on your foot  and ankle. Please complete this exercise only if specifically instructed by your caregiver.  Place the ball of your right / left foot on a step, keeping your other foot firmly on the same step. Hold on to the wall or a rail for balance. Slowly lift your other foot, allowing your body weight to press your heel down over the edge of the step. You should feel a stretch in your right / left calf. Hold this position for 10 seconds. Repeat this exercise with a slight bend in your right / left knee. Repeat 3 times. Complete this stretch 2 times per day.   STRENGTHENING EXERCISES - Plantar Fasciitis (Heel Spur Syndrome)  These exercises may help you when beginning to rehabilitate your injury. They may resolve your symptoms with or without further involvement from your physician, physical therapist or athletic trainer. While completing these exercises, remember:  Muscles can gain both the endurance and the strength needed for everyday activities through controlled exercises. Complete these exercises as instructed by your physician, physical therapist or athletic trainer. Progress the resistance and repetitions only as guided.  STRENGTH - Towel Curls Sit in a chair positioned on a non-carpeted surface. Place your foot on a towel, keeping your heel on the floor. Pull the towel toward your heel by only curling your toes. Keep your heel on the floor. Repeat 3 times. Complete this exercise 2 times per day.  STRENGTH - Ankle Inversion Secure one end of a rubber exercise band/tubing to a fixed object (table, pole). Loop the other end around your foot just before your toes. Place your fists between your knees. This will focus your strengthening at your ankle. Slowly, pull your big toe up and in, making sure the band/tubing is positioned to resist the entire motion. Hold this position for 10 seconds. Have your muscles resist the band/tubing as it slowly pulls your foot back to the starting position. Repeat 3  times. Complete this exercises 2 times per day.  Document Released: 11/23/2005 Document Revised: 02/15/2012 Document Reviewed: 03/07/2009 [employer] Patient Information 2014 [employer].  

## 2022-08-21 NOTE — Progress Notes (Unsigned)
Subjective:   Patient ID: Mason Rangel, male   DOB: 56 y.o.   MRN: [id number]   HPI Chief Complaint  Patient presents with   Foot Pain    Patient came in left heel pain started 3 mths, rate of pain is a 5 out 10, throbbing, feels the most pain at home walking without shoes, after sitting for long priends of time, X-rays done today    -year-old male presents the above concerns.  He states that he started having pain in the left foot it feels just like it did on the right side previously.  States it hurts when he first stands up in the morning after time of rest.  No injuries.  No numbness or tingling.   Review of Systems  All other systems reviewed and are negative.   Past Medical History:  Diagnosis Date   Anxiety    Gout    Hypertension     Past Surgical History:  Procedure Laterality Date   ESOPHAGEAL DILATION     upper endoscopy with dilation  2009     Current Outpatient Medications:    amLODipine (NORVASC) 10 MG tablet, Take 1 tablet (10 mg total) by mouth daily., Disp: 90 tablet, Rfl: 1   clonazePAM (KLONOPIN) 0.5 MG tablet, Take 1-2 tablets 30 minutes before flying for anxiety., Disp: 20 tablet, Rfl: 0   cyclobenzaprine (FLEXERIL) 5 MG tablet, Take 1 tablet (5 mg total) by mouth 3 (three) times daily as needed for muscle spasms., Disp: 30 tablet, Rfl: 1   dexlansoprazole (DEXILANT) 60 MG capsule, Take 1 capsule (60 mg total) by mouth daily., Disp: 90 capsule, Rfl: 3   olmesartan-hydrochlorothiazide (BENICAR HCT) 40-12.5 MG tablet, Take 1 tablet by mouth daily., Disp: 90 tablet, Rfl: 1   phentermine (ADIPEX-P) 37.5 MG tablet, Take 37.5 mg by mouth daily before breakfast., Disp: , Rfl:    sucralfate (CARAFATE) 1 g tablet, Take 1 tablet (1 g total) by mouth 4 (four) times daily., Disp: 120 tablet, Rfl: 0   Tadalafil 2.5 MG TABS, TAKE 1 TABLET BY MOUTH ONCE DAILY FOR 90 DAYS, Disp: , Rfl:    testosterone cypionate (DEPOTESTOTERONE CYPIONATE) 100 MG/ML injection, Inject  into the muscle every 7 (seven) days. For IM use only, Disp: , Rfl:    tirzepatide (MOUNJARO) 5 MG/0.5ML Pen, Inject 5 mg into the skin once a week., Disp: , Rfl:    traZODone (DESYREL) 50 MG tablet, TAKE 1 TO 2 TABLETS BY MOUTH AT BEDTIME AS NEEDED ., Disp: 180 tablet, Rfl: 3   valACYclovir (VALTREX) 1000 MG tablet, Take 1 tablet (1,000 mg total) by mouth daily., Disp: 90 tablet, Rfl: 1   zolpidem (AMBIEN) 10 MG tablet, Take 1 tablet (10 mg total) by mouth at bedtime., Disp: 90 tablet, Rfl: 1  Allergies  Allergen Reactions   Morphine Anaphylaxis, Nausea Only and Other (See Comments)    Dizziness     Lorazepam Other (See Comments)    Severe nightmares   Morphine And Related         Objective:  Physical Exam  General: AAO x3, NAD  Dermatological: Skin is warm, dry and supple bilateral. There are no open sores, no preulcerative lesions, no rash or signs of infection present.  Vascular: Dorsalis Pedis artery and Posterior Tibial artery pedal pulses are 2/4 bilateral with immedate capillary fill time. There is no pain with calf compression, swelling, warmth, erythema.   Neruologic: Grossly intact via light touch bilateral.  Negative Tinel sign.  Musculoskeletal: Tenderness to palpation along the plantar medial tubercle of the calcaneus at the insertion of plantar fascia on the left foot. There is no pain along the course of the plantar fascia within the arch of the foot. Plantar fascia appears to be intact. There is no pain with lateral compression of the calcaneus or pain with vibratory sensation. There is no pain along the course or insertion of the achilles tendon. No other areas of tenderness to bilateral lower extremities.Muscular strength 5/5 in all groups tested bilateral.  Gait: Unassisted, Nonantalgic.       Assessment:   Left heel pain, Plantar fasciitis     Plan:  -Treatment options discussed including all alternatives, risks, and complications -Etiology of symptoms  were discussed -X-rays were obtained and reviewed with the patient.  3 views of the left foot were obtained.  There is no evidence of acute fracture.  Calcaneal spurring is present. -Steroid injection performed left foot.  See procedure note below. -Dispensed a night splint as well as a plantar fascial brace to help support and stretch the plantar fascia. -Discussed shoes and continue good arch support. -Stretching, icing daily  Procedure: Injection Tendon/Ligament Discussed alternatives, risks, complications and verbal consent was obtained.  Location: Left plantar fascia at the glabrous junction; medial approach. Skin Prep: Alcohol. Injectate: 0.5cc 0.5% marcaine plain, 0.5 cc 2% lidocaine plain and, 1 cc kenalog 10. Disposition: Patient tolerated procedure well. Injection site dressed with a band-aid.  Post-injection care was discussed and return precautions discussed.   Return in about 4 weeks (around 09/18/2022).[name]

## 2022-08-21 NOTE — Telephone Encounter (Signed)
Need new referral because [facility] no in network for insurance.

## 2022-09-04 ENCOUNTER — Telehealth: Payer: Self-pay

## 2022-09-04 NOTE — Telephone Encounter (Signed)
Initiated Prior authorization PNP:[id number] 60MG  dr capsules Via: Covermymeds Case/Key:[id number] Status: Pending as of 09/04/22 Reason: Notified Pt via: Mychart

## 2022-09-12 ENCOUNTER — Other Ambulatory Visit: Payer: Self-pay | Admitting: Physician Assistant

## 2022-09-12 DIAGNOSIS — F5101 Primary insomnia: Secondary | ICD-10-CM

## 2022-09-14 NOTE — Telephone Encounter (Signed)
Routing to covering provider.   Last OV: 08/17/22 Next OV: 09/28/22 Last RF: 02/17/22

## 2022-09-15 ENCOUNTER — Other Ambulatory Visit: Payer: Self-pay | Admitting: Physician Assistant

## 2022-09-15 DIAGNOSIS — K2100 Gastro-esophageal reflux disease with esophagitis, without bleeding: Secondary | ICD-10-CM

## 2022-09-18 ENCOUNTER — Ambulatory Visit: Payer: 59 | Admitting: Podiatry

## 2022-09-18 DIAGNOSIS — M722 Plantar fascial fibromatosis: Secondary | ICD-10-CM

## 2022-09-18 MED ORDER — TRIAMCINOLONE ACETONIDE 10 MG/ML IJ SUSP
10.0000 mg | Freq: Once | INTRAMUSCULAR | Status: AC
  Administered 2022-09-18: 10 mg

## 2022-09-18 NOTE — Patient Instructions (Signed)

## 2022-09-20 NOTE — Progress Notes (Signed)
Subjective:   Patient ID: Mason Rangel, male   DOB: 56 y.o.   MRN: [id number]   HPI Chief Complaint  Patient presents with   Plantar Fasciitis    Left foot plantar fasciitis, doing a little bit better, Rate of pain 6 out of 10, TX: Brace, Icing, Stretching    56 year old male presents with above concerns.  States last injection did help but he Mason Rangel having pain.  He actually just got back from the gym as well.  He Mason Rangel wearing the brace.  He has been stretching, icing regularly.      Objective:  Physical Exam  General: AAO x3, NAD  Dermatological: Skin is warm, dry and supple bilateral. There are no open sores, no preulcerative lesions, no rash or signs of infection present.  Vascular: Dorsalis Pedis artery and Posterior Tibial artery pedal pulses are 2/4 bilateral with immedate capillary fill time. There is no pain with calf compression, swelling, warmth, erythema.   Neruologic: Grossly intact via light touch bilateral.  Negative Tinel sign.  Musculoskeletal: There is Suber tenderness to palpation along the plantar medial tubercle of the calcaneus at the insertion of plantar fascia on the left foot. There is no pain along the course of the plantar fascia within the arch of the foot. Plantar fascia appears to be intact. There is no pain with lateral compression of the calcaneus or pain with vibratory sensation. There is no pain along the course or insertion of the achilles tendon. No other areas of tenderness to bilateral lower extremities.Muscular strength 5/5 in all groups tested bilateral.  Gait: Unassisted, Nonantalgic.       Assessment:   Left heel pain, Plantar fasciitis     Plan:  -Treatment options discussed including all alternatives, risks, and complications -Etiology of symptoms were discussed -X-rays were obtained and reviewed with the patient.  3 views of the left foot were obtained.  There is no evidence of acute fracture.  Calcaneal spurring is  present. -Steroid injection performed left foot.  This is a second injection.  See procedure note below. -Continue stretching, icing a regular basis as well as using the plantar fascial brace. -Discussed shoes and continue good arch support. -Stretching, icing daily  Procedure: Injection Tendon/Ligament Discussed alternatives, risks, complications and verbal consent was obtained.  Location: Left plantar fascia at the glabrous junction; medial approach. Skin Prep: Alcohol. Injectate: 0.5cc 0.5% marcaine plain, 0.5 cc 2% lidocaine plain and, 1 cc kenalog 10. Disposition: Patient tolerated procedure well. Injection site dressed with a band-aid.  Post-injection care was discussed and return precautions discussed.   Return in about 4 weeks (around 10/16/2022).  Trula Slade DPM

## 2022-09-21 ENCOUNTER — Telehealth (INDEPENDENT_AMBULATORY_CARE_PROVIDER_SITE_OTHER): Payer: 59 | Admitting: Physician Assistant

## 2022-09-21 ENCOUNTER — Ambulatory Visit: Payer: 59 | Admitting: Physician Assistant

## 2022-09-21 ENCOUNTER — Encounter: Payer: Self-pay | Admitting: Physician Assistant

## 2022-09-21 DIAGNOSIS — R519 Headache, unspecified: Secondary | ICD-10-CM

## 2022-09-21 DIAGNOSIS — J3489 Other specified disorders of nose and nasal sinuses: Secondary | ICD-10-CM

## 2022-09-21 DIAGNOSIS — R0981 Nasal congestion: Secondary | ICD-10-CM | POA: Diagnosis not present

## 2022-09-21 DIAGNOSIS — U071 COVID-19: Secondary | ICD-10-CM

## 2022-09-21 LAB — POC COVID19 BINAXNOW: SARS Coronavirus 2 Ag: POSITIVE — AB

## 2022-09-21 MED ORDER — NIRMATRELVIR/RITONAVIR (PAXLOVID) TABLET (RENAL DOSING): 2.0000 | ORAL_TABLET | Freq: Two times a day (BID) | ORAL | 0 refills | Status: AC

## 2022-09-21 NOTE — Progress Notes (Signed)
   Acute Office Visit  Subjective:     Patient ID: Mason Rangel, male    DOB: Apr 22, 1966, 56 y.o.   MRN: [id number]  Chief Complaint  Patient presents with   Cough    HPI Patient is in today for 3 days of sinus congestion, dry cough, headache and fatigue. No other sick contacts. No labored breathing, fever, chills, body aches. Taking OTC medications for congestion and using nasal spray. Not tested for covid.   .. Active Ambulatory Problems    Diagnosis Date Noted   Anxiety 04/06/2016   Insomnia 04/06/2016   Adjustment disorder with mixed anxiety and depressed mood 04/06/2016   Essential hypertension, benign 04/06/2016   GERD (gastroesophageal reflux disease) 04/06/2016   Esophageal stenosis 04/06/2016   Hypogonadism in male 10/19/2016   LVH (left ventricular hypertrophy) due to hypertensive disease, without heart failure 66/44/0347   Diastolic dysfunction 42/59/5638   Plantar fasciitis 12/28/2017   SVT (supraventricular tachycardia) 07/18/2018   Muscle spasm of back 07/18/2018   Dizziness 08/09/2018   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   Low back pain 08/09/2018   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   Syncope 08/09/2018   Thrombosed external hemorrhoid 11/20/2014   Palpitations 08/12/2018   Rupture of left distal biceps tendon 06/22/2019   Gastroesophageal reflux disease with esophagitis 09/28/2019   Hypertensive crisis 09/28/2019   Elevated LDL cholesterol level 02/18/2022   Anxiety with flying 03/27/2022   Hypotension 08/17/2022   Abnormal weight gain 08/17/2022   Hiccups 08/17/2022   Burping 08/17/2022   AKI (acute kidney injury) ([place]) 08/17/2022   Resolved Ambulatory Problems    Diagnosis Date Noted   Left sided chest pain 04/06/2016   Swelling of foot joint 07/10/2016   Right foot pain 07/10/2016   Hypertension, uncontrolled 11/22/2017   Epididymitis 08/09/2018   Fever 08/09/2018   Screening for  cholesterol level 08/12/2018   Stage 3a chronic kidney disease ([place]) 05/01/2020   Acute bilateral low back pain without sciatica 05/01/2020   Past Medical History:  Diagnosis Date   Gout    Hypertension      ROS  See HPI.     Objective:      Physical Exam  No labored breathing Pale appearance Dry cough    Assessment & Plan:  [name] KitchenMarland KitchenRoey was seen today for cough.  Diagnoses and all orders for this visit:  COVID-19 virus infection -     nirmatrelvir/ritonavir EUA, renal dosing, (PAXLOVID) 10 x 150 MG & 10 x 100MG  TABS; Take 2 tablets by mouth 2 (two) times daily for 5 days. (Take nirmatrelvir 150 mg one tablet twice daily for 5 days and ritonavir 100 mg one tablet twice daily for 5 days) Patient GFR is 43.  Sinus pressure -     POC COVID-19  Nasal congestion -     POC COVID-19  Sinus headache -     POC COVID-19   Day 3 of symptoms Covid positive in office Sent paxlovid but renal dosing due to last GFR of 43 Discussed symptomatic care with OTC medications and quarantine for 5 days and then wear mask for full 10 days Follow up as needed or if symptoms worsen or change  Iran Planas, PA-C

## 2022-09-22 ENCOUNTER — Other Ambulatory Visit: Payer: Self-pay | Admitting: Physician Assistant

## 2022-09-22 DIAGNOSIS — K2100 Gastro-esophageal reflux disease with esophagitis, without bleeding: Secondary | ICD-10-CM

## 2022-09-22 NOTE — Telephone Encounter (Signed)
Nexium not on current med list, last written in September 2022 for one year. Please advise.

## 2022-09-28 ENCOUNTER — Ambulatory Visit (INDEPENDENT_AMBULATORY_CARE_PROVIDER_SITE_OTHER): Payer: 59 | Admitting: Physician Assistant

## 2022-09-28 ENCOUNTER — Encounter: Payer: Self-pay | Admitting: Physician Assistant

## 2022-09-28 VITALS — BP 138/90 | HR 94 | Ht 72.0 in | Wt 216.0 lb

## 2022-09-28 DIAGNOSIS — I10 Essential (primary) hypertension: Secondary | ICD-10-CM

## 2022-09-28 DIAGNOSIS — R7989 Other specified abnormal findings of blood chemistry: Secondary | ICD-10-CM | POA: Diagnosis not present

## 2022-09-28 DIAGNOSIS — R635 Abnormal weight gain: Secondary | ICD-10-CM | POA: Diagnosis not present

## 2022-09-28 DIAGNOSIS — R69 Illness, unspecified: Secondary | ICD-10-CM | POA: Diagnosis not present

## 2022-09-28 DIAGNOSIS — K219 Gastro-esophageal reflux disease without esophagitis: Secondary | ICD-10-CM

## 2022-09-28 DIAGNOSIS — N179 Acute kidney failure, unspecified: Secondary | ICD-10-CM | POA: Diagnosis not present

## 2022-09-28 DIAGNOSIS — F5101 Primary insomnia: Secondary | ICD-10-CM

## 2022-09-28 NOTE — Progress Notes (Signed)
Established Patient Office Visit  Subjective   Patient ID: Mason Rangel, male    DOB: Feb 02, 1966  Age: 56 y.o. MRN: [id number]  Chief Complaint  Patient presents with   Follow-up    HPI Pt is a 56 yo male with HTN, GERD, elevated serum creatinine, AKI who presents to the clinic for follow up. He had covid about 2 weeks ago.   He is feeling better from covid. No residual symptoms.   He denies any CP, palpitations, headaches, vision changes. He is taking 1/2 tablet of norvasc because BP medication was decreased at last visit due to low BP. He continues on benicar HCT. He admits he has not been taking it all. He will start back. He has also not been checking BP at home.   Hiccups resolved. Staying at 2.5mg  dose of mounjaro.   GERD-dexilant not covered. On nexium.  Needs ambein refilled for sleep.    .. Active Ambulatory Problems    Diagnosis Date Noted   Anxiety 04/06/2016   Insomnia 04/06/2016   Adjustment disorder with mixed anxiety and depressed mood 04/06/2016   Essential hypertension, benign 04/06/2016   Gastroesophageal reflux disease 04/06/2016   Esophageal stenosis 04/06/2016   Hypogonadism in male 10/19/2016   LVH (left ventricular hypertrophy) due to hypertensive disease, without heart failure 03/24/2017   Diastolic dysfunction 04/28/2017   Plantar fasciitis 12/28/2017   SVT (supraventricular tachycardia) 07/18/2018   Muscle spasm of back 07/18/2018   Dizziness 08/09/2018   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   Low back pain 08/09/2018   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   Syncope 08/09/2018   Thrombosed external hemorrhoid 11/20/2014   Palpitations 08/12/2018   Rupture of left distal biceps tendon 06/22/2019   Gastroesophageal reflux disease with esophagitis 09/28/2019   Hypertensive crisis 09/28/2019   Elevated LDL cholesterol level 02/18/2022   Anxiety with flying 03/27/2022   Hypotension  08/17/2022   Abnormal weight gain 08/17/2022   Hiccups 08/17/2022   Burping 08/17/2022   AKI (acute kidney injury) (HCC) 08/17/2022   Elevated serum creatinine 09/29/2022   Resolved Ambulatory Problems    Diagnosis Date Noted   Left sided chest pain 04/06/2016   Swelling of foot joint 07/10/2016   Right foot pain 07/10/2016   Hypertension, uncontrolled 11/22/2017   Epididymitis 08/09/2018   Fever 08/09/2018   Screening for cholesterol level 08/12/2018   Stage 3a chronic kidney disease (HCC) 05/01/2020   Acute bilateral low back pain without sciatica 05/01/2020   Past Medical History:  Diagnosis Date   Gout    Hypertension      Review of Systems  All other systems reviewed and are negative.     Objective:     BP (!) 138/90   Pulse 94   Ht 6' (1.829 m)   Wt 216 lb (98 kg)   SpO2 96%   BMI 29.29 kg/m  BP Readings from Last 3 Encounters:  09/28/22 (!) 138/90  08/17/22 103/75  08/14/22 (!) 124/94   Wt Readings from Last 3 Encounters:  09/28/22 216 lb (98 kg)  08/17/22 216 lb (98 kg)  08/14/22 214 lb (97.1 kg)      Physical Exam Constitutional:      Appearance: Normal appearance.  Cardiovascular:     Rate and Rhythm: Normal rate and regular rhythm.  Pulmonary:     Effort: Pulmonary effort is normal.     Breath sounds: Normal breath sounds.  Musculoskeletal:  Right lower leg: No edema.     Left lower leg: No edema.  Neurological:     General: No focal deficit present.     Mental Status: He is alert and oriented to person, place, and time.  Psychiatric:        Mood and Affect: Mood normal.        Assessment & Plan:  [name] KitchenMarland KitchenJayion was seen today for follow-up.  Diagnoses and all orders for this visit:  AKI (acute kidney injury) ([facility]) -     BASIC METABOLIC PANEL WITH GFR  Elevated serum creatinine -     BASIC METABOLIC PANEL WITH GFR  Gastroesophageal reflux disease, unspecified whether esophagitis present  Essential hypertension, benign -      BASIC METABOLIC PANEL WITH GFR   BP better on 2nd recheck Get back on medications regular and check at home  Goal BP under 130/80 Continue on nexium for GERD  Continue on low dose mounjaro 2.5mg  weekly BMP ordered to follow up on creatinine and AKI Ambien refilled Follow up in 6 months   Iran Planas, PA-C

## 2022-09-29 DIAGNOSIS — R7989 Other specified abnormal findings of blood chemistry: Secondary | ICD-10-CM | POA: Insufficient documentation

## 2022-09-29 MED ORDER — AMLODIPINE BESYLATE 5 MG PO TABS: 5.0000 mg | ORAL_TABLET | Freq: Every day | ORAL | 0 refills | Status: DC

## 2022-09-29 MED ORDER — ZOLPIDEM TARTRATE 10 MG PO TABS: 10.0000 mg | ORAL_TABLET | Freq: Every day | ORAL | 1 refills | Status: DC

## 2022-09-29 MED ORDER — TIRZEPATIDE 5 MG/0.5ML ~~LOC~~ SOAJ: 2.5000 mg | SUBCUTANEOUS | Status: DC

## 2022-10-05 DIAGNOSIS — R7989 Other specified abnormal findings of blood chemistry: Secondary | ICD-10-CM | POA: Diagnosis not present

## 2022-10-06 LAB — BASIC METABOLIC PANEL WITH GFR
BUN/Creatinine Ratio: 12 (calc) (ref 6–22)
BUN: 16 mg/dL (ref 7–25)
CO2: 25 mmol/L (ref 20–32)
Calcium: 9.4 mg/dL (ref 8.6–10.3)
Chloride: 104 mmol/L (ref 98–110)
Creat: 1.31 mg/dL — ABNORMAL HIGH (ref 0.70–1.30)
Glucose, Bld: 87 mg/dL (ref 65–99)
Potassium: 4.4 mmol/L (ref 3.5–5.3)
Sodium: 140 mmol/L (ref 135–146)
eGFR: 63 mL/min/{1.73_m2} (ref >=60)

## 2022-10-06 NOTE — Progress Notes (Signed)
GFR improving 1 month ago was 43 and now 63.  Sodium normal.   Continue to limit use of NSAIDS, drink plenty of water and keep BP controlled. Recheck in 3 months.

## 2022-10-16 ENCOUNTER — Ambulatory Visit: Payer: 59 | Admitting: Podiatry

## 2022-10-16 DIAGNOSIS — M722 Plantar fascial fibromatosis: Secondary | ICD-10-CM | POA: Diagnosis not present

## 2022-10-16 NOTE — Patient Instructions (Signed)

## 2022-10-18 NOTE — Progress Notes (Signed)
Subjective:   Patient ID: Mason Rangel, male   DOB: 56 y.o.   MRN: [id number]   HPI Chief Complaint  Patient presents with   Plantar Fasciitis    Left heel pain, rate of pain 2 out of 10, patient states he is back running,     56 year old male presents with above concerns.  States he is doing much better.  7 some discomfort he is back to more normal activities with his less pain.     Objective:  Physical Exam  General: AAO x3, NAD  Dermatological: Skin is warm, dry and supple bilateral. There are no open sores, no preulcerative lesions, no rash or signs of infection present.  Vascular: Dorsalis Pedis artery and Posterior Tibial artery pedal pulses are 2/4 bilateral with immedate capillary fill time. There is no pain with calf compression, swelling, warmth, erythema.   Neruologic: Grossly intact via light touch bilateral.  Negative Tinel sign.  Musculoskeletal: There is [place] some slight discomfort to palpation along the plantar medial tubercle of the calcaneus at the insertion of plantar fascia on the left foot. There is no pain along the course of the plantar fascia within the arch of the foot. Plantar fascia appears to be intact. There is no pain with lateral compression of the calcaneus or pain with vibratory sensation. There is no pain along the course or insertion of the achilles tendon. No other areas of tenderness to bilateral lower extremities.Muscular strength 5/5 in all groups tested bilateral.  Gait: Unassisted, Nonantalgic.       Assessment:   Left heel pain, Plantar fasciitis-improving     Plan:  -Treatment options discussed including all alternatives, risks, and complications -Etiology of symptoms were discussed -At this point he is doing much better as we have another injection.  Continue ice, elevate as well as shoes and good arch support.  Discussed gradual increase activity level as tolerated but make sure he is [place] being consistent with the exercises in  shoes to help prevent reoccurrence.  Return if symptoms worsen or fail to improve.  [name]

## 2022-12-15 ENCOUNTER — Encounter: Payer: Self-pay | Admitting: Physician Assistant

## 2022-12-15 ENCOUNTER — Ambulatory Visit (INDEPENDENT_AMBULATORY_CARE_PROVIDER_SITE_OTHER): Payer: 59 | Admitting: Physician Assistant

## 2022-12-15 VITALS — BP 149/83 | HR 88 | Temp 98.3°F

## 2022-12-15 DIAGNOSIS — Z8619 Personal history of other infectious and parasitic diseases: Secondary | ICD-10-CM | POA: Diagnosis not present

## 2022-12-15 DIAGNOSIS — L299 Pruritus, unspecified: Secondary | ICD-10-CM

## 2022-12-15 DIAGNOSIS — G509 Disorder of trigeminal nerve, unspecified: Secondary | ICD-10-CM | POA: Diagnosis not present

## 2022-12-15 MED ORDER — ACETIC ACID 2 % OT SOLN: 6.0000 [drp] | Freq: Three times a day (TID) | OTIC | 0 refills | Status: DC

## 2022-12-15 MED ORDER — VALACYCLOVIR HCL 1 G PO TABS: 1000.0000 mg | ORAL_TABLET | Freq: Three times a day (TID) | ORAL | 0 refills | Status: DC

## 2022-12-15 NOTE — Progress Notes (Unsigned)
   Acute Office Visit  Subjective:     Patient ID: Mason Rangel, male    DOB: 09/26/1966, 57 y.o.   MRN: [id number]  No chief complaint on file.   HPI Patient is in today for ***  ROS      Objective:    There were no vitals taken for this visit. {Vitals History (Optional):23777}  Physical Exam  No results found for any visits on 12/15/22.      Assessment & Plan:   Problem List Items Addressed This Visit   None   No orders of the defined types were placed in this encounter.   No follow-ups on file.  Iran Planas, PA-C

## 2022-12-16 ENCOUNTER — Encounter: Payer: Self-pay | Admitting: Physician Assistant

## 2022-12-16 DIAGNOSIS — Z8619 Personal history of other infectious and parasitic diseases: Secondary | ICD-10-CM | POA: Insufficient documentation

## 2022-12-16 DIAGNOSIS — L299 Pruritus, unspecified: Secondary | ICD-10-CM | POA: Insufficient documentation

## 2022-12-16 DIAGNOSIS — G509 Disorder of trigeminal nerve, unspecified: Secondary | ICD-10-CM | POA: Insufficient documentation

## 2022-12-16 MED ORDER — HYDROXYZINE PAMOATE 25 MG PO CAPS: 25.0000 mg | ORAL_CAPSULE | Freq: Four times a day (QID) | ORAL | 0 refills | Status: DC | PRN

## 2022-12-16 MED ORDER — PREDNISONE 50 MG PO TABS: ORAL_TABLET | ORAL | 0 refills | Status: DC

## 2023-01-09 ENCOUNTER — Other Ambulatory Visit: Payer: Self-pay | Admitting: Physician Assistant

## 2023-01-09 DIAGNOSIS — F5101 Primary insomnia: Secondary | ICD-10-CM

## 2023-01-09 DIAGNOSIS — G509 Disorder of trigeminal nerve, unspecified: Secondary | ICD-10-CM

## 2023-01-09 DIAGNOSIS — Z8619 Personal history of other infectious and parasitic diseases: Secondary | ICD-10-CM

## 2023-01-09 DIAGNOSIS — I10 Essential (primary) hypertension: Secondary | ICD-10-CM

## 2023-01-11 MED ORDER — ZOLPIDEM TARTRATE 10 MG PO TABS: 10.0000 mg | ORAL_TABLET | Freq: Every day | ORAL | 1 refills | Status: DC

## 2023-01-11 MED ORDER — TRAZODONE HCL 50 MG PO TABS: ORAL_TABLET | ORAL | 3 refills | Status: DC

## 2023-02-26 ENCOUNTER — Other Ambulatory Visit: Payer: Self-pay | Admitting: Physician Assistant

## 2023-02-26 DIAGNOSIS — G509 Disorder of trigeminal nerve, unspecified: Secondary | ICD-10-CM

## 2023-02-26 DIAGNOSIS — Z8619 Personal history of other infectious and parasitic diseases: Secondary | ICD-10-CM

## 2023-03-22 ENCOUNTER — Encounter: Payer: Self-pay | Admitting: *Deleted

## 2023-03-30 DIAGNOSIS — N289 Disorder of kidney and ureter, unspecified: Secondary | ICD-10-CM | POA: Diagnosis not present

## 2023-03-31 ENCOUNTER — Other Ambulatory Visit: Payer: Self-pay | Admitting: Physician Assistant

## 2023-03-31 ENCOUNTER — Ambulatory Visit (INDEPENDENT_AMBULATORY_CARE_PROVIDER_SITE_OTHER): Payer: 59 | Admitting: Physician Assistant

## 2023-03-31 ENCOUNTER — Encounter: Payer: Self-pay | Admitting: Physician Assistant

## 2023-03-31 VITALS — BP 148/72 | HR 77 | Ht 72.0 in | Wt 210.0 lb

## 2023-03-31 DIAGNOSIS — N1831 Chronic kidney disease, stage 3a: Secondary | ICD-10-CM

## 2023-03-31 DIAGNOSIS — I10 Essential (primary) hypertension: Secondary | ICD-10-CM | POA: Diagnosis not present

## 2023-03-31 DIAGNOSIS — K219 Gastro-esophageal reflux disease without esophagitis: Secondary | ICD-10-CM

## 2023-03-31 DIAGNOSIS — R7989 Other specified abnormal findings of blood chemistry: Secondary | ICD-10-CM

## 2023-03-31 DIAGNOSIS — F5101 Primary insomnia: Secondary | ICD-10-CM

## 2023-03-31 DIAGNOSIS — E663 Overweight: Secondary | ICD-10-CM

## 2023-03-31 DIAGNOSIS — R635 Abnormal weight gain: Secondary | ICD-10-CM

## 2023-03-31 LAB — COMPLETE METABOLIC PANEL WITH GFR
AG Ratio: 1.7 (calc) (ref 1.0–2.5)
ALT: 22 U/L (ref 9–46)
AST: 24 U/L (ref 10–35)
Albumin: 4.5 g/dL (ref 3.6–5.1)
Alkaline phosphatase (APISO): 49 U/L (ref 35–144)
BUN/Creatinine Ratio: 14 (calc) (ref 6–22)
BUN: 23 mg/dL (ref 7–25)
CO2: 25 mmol/L (ref 20–32)
Calcium: 9.5 mg/dL (ref 8.6–10.3)
Chloride: 103 mmol/L (ref 98–110)
Creat: 1.61 mg/dL — ABNORMAL HIGH (ref 0.70–1.30)
Globulin: 2.7 g/dL (calc) (ref 1.9–3.7)
Glucose, Bld: 90 mg/dL (ref 65–99)
Potassium: 4.5 mmol/L (ref 3.5–5.3)
Sodium: 136 mmol/L (ref 135–146)
Total Bilirubin: 0.7 mg/dL (ref 0.2–1.2)
Total Protein: 7.2 g/dL (ref 6.1–8.1)
eGFR: 49 mL/min/{1.73_m2} — ABNORMAL LOW (ref >=60)

## 2023-03-31 MED ORDER — AMLODIPINE BESYLATE 5 MG PO TABS: 5.0000 mg | ORAL_TABLET | Freq: Every day | ORAL | 1 refills | Status: DC

## 2023-03-31 MED ORDER — TIRZEPATIDE 5 MG/0.5ML ~~LOC~~ SOAJ
7.5000 mg | SUBCUTANEOUS | Status: DC
Start: 2023-03-31 — End: 2023-07-22

## 2023-03-31 MED ORDER — OLMESARTAN MEDOXOMIL-HCTZ 40-25 MG PO TABS: 1.0000 | ORAL_TABLET | Freq: Every day | ORAL | 1 refills | Status: DC

## 2023-03-31 MED ORDER — DAPAGLIFLOZIN PROPANEDIOL 10 MG PO TABS
10.0000 mg | ORAL_TABLET | Freq: Every day | ORAL | 2 refills | Status: DC
Start: 2023-03-31 — End: 2023-03-31

## 2023-03-31 NOTE — Progress Notes (Signed)
I see you have visit today where we can discuss!  GFR has decreased again and creatinine increased.

## 2023-03-31 NOTE — Patient Instructions (Signed)
Chronic Kidney Disease, Adult Chronic kidney disease (CKD) occurs when the kidneys are slowly and permanently damaged over a long period of time. The kidneys are a pair of organs that do many important jobs in the body, including: Removing waste and extra fluid from the blood to make urine. Making hormones that maintain the amount of fluid in tissues and blood vessels. Maintaining the right amount of fluids and chemicals in the body. A small amount of kidney damage may not cause problems, but a large amount of damage may make it hard or impossible for the kidneys to work right. Steps must be taken to slow kidney damage or to stop it from getting worse. If steps are not taken, the kidneys may stop working permanently (end-stage renal disease, or ESRD). Most of the time, CKD does not go away, but it can often be controlled. People who have CKD are usually able to live full lives. What are the causes? The most common causes of this condition are diabetes and high blood pressure (hypertension). Other causes include: Cardiovascular diseases. These affect the heart and blood vessels. Kidney diseases. These include: Glomerulonephritis, or inflammation of the tiny filters in the kidneys. Interstitial nephritis. This is swelling of the small tubes of the kidneys and of the surrounding structures. Polycystic kidney disease, in which clusters of fluid-filled sacs form within the kidneys. Renal vascular disease. This includes disorders that affect the arteries and veins of the kidneys. Diseases that affect the body's defense system (immune system). A problem with urine flow. This may be caused by: Kidney stones. Cancer. An enlarged prostate, in males. A kidney infection or urinary tract infection (UTI) that keeps coming back. Vasculitis. This is swelling or inflammation of the blood vessels. What increases the risk? Your chances of having kidney disease increase with age. The following factors may make  you more likely to develop this condition: A family history of kidney disease or kidney failure. Kidney failure means the kidneys can no longer work right. Certain genetic diseases. Taking medicines often that are damaging to the kidneys. Being around or being in contact with toxic substances. Obesity. A history of tobacco use. What are the signs or symptoms? Symptoms of this condition include: Feeling very tired (lethargic) and having less energy. Swelling, or edema, of the face, legs, ankles, or feet. Nausea or vomiting, or loss of appetite. Confusion or trouble concentrating. Muscle twitches and cramps, especially in the legs. Dry, itchy skin. A metallic taste in the mouth. Producing less urine, or producing more urine (especially at night). Shortness of breath. Trouble sleeping. CKD may also result in not having enough red blood cells or hemoglobin in the blood (anemia) or having weak bones (bone disease). Symptoms develop slowly and may not be obvious until the kidney damage becomes severe. It is possible to have kidney disease for years without having symptoms. How is this diagnosed? This condition may be diagnosed based on: Blood tests. Urine tests. Imaging tests, such as an ultrasound or a CT scan. A kidney biopsy. This involves removing a sample of kidney tissue to be looked at under a microscope. Results from these tests will help to determine how serious the CKD is. How is this treated? There is no cure for most cases of this condition, but treatment usually relieves symptoms and prevents or slows the worsening of the disease. Treatment may include: Diet changes, which may require you to avoid alcohol and foods that are high in salt, potassium, phosphorous, and protein. Medicines. These may:   Lower blood pressure. Control blood sugar (glucose). Relieve anemia. Relieve swelling. Protect your bones. Improve the balance of salts and minerals in your blood  (electrolytes). Dialysis, which is a type of treatment that removes toxic waste from the body. It may be needed if you have kidney failure. Managing any other conditions that are causing your CKD or making it worse. Follow these instructions at home: Medicines Take over-the-counter and prescription medicines only as told by your health care provider. The amount of some medicines that you take may need to be changed. Do not take any new medicines unless approved by your health care provider. Many medicines can make kidney damage worse. Do not take any vitamin and mineral supplements unless approved by your health care provider. Many nutritional supplements can make kidney damage worse. Lifestyle  Do not use any products that contain nicotine or tobacco, such as cigarettes, e-cigarettes, and chewing tobacco. If you need help quitting, ask your health care provider. If you drink alcohol: Limit how much you use to: 0-1 drink a day for women who are not pregnant. 0-2 drinks a day for men. Know how much alcohol is in your drink. In the [place] one drink equals one 12 oz bottle of beer (355 mL), one 5 oz glass of wine (148 mL), or one 1 oz glass of hard liquor (44 mL). Maintain a healthy weight. If you need help, ask your health care provider. General instructions  Follow instructions from your health care provider about eating or drinking restrictions, including any prescribed diet. Track your blood pressure at home. Report changes in your blood pressure as told. If you are being treated for diabetes, track your blood glucose levels as told. Start or continue an exercise plan. Exercise at least 30 minutes a day, 5 days a week. Keep your immunizations up to date as told. Keep all follow-up visits. This is important. Where to find more information American Association of Kidney Patients: www.aakp.org National Kidney Foundation: www.kidney.org American Kidney Fund: www.akfinc.org Life Options:  www.lifeoptions.org Kidney School: www.kidneyschool.org Contact a health care provider if: Your symptoms get worse. You develop new symptoms. Get help right away if: You develop symptoms of ESRD. These include: Headaches. Numbness in your hands or feet. Easy bruising. Frequent hiccups. Chest pain. Shortness of breath. Lack of menstrual periods, in women. You have a fever. You are producing less urine than usual. You have pain or bleeding when you urinate or when you have a bowel movement. These symptoms may represent a serious problem that is an emergency. Do not wait to see if the symptoms will go away. Get medical help right away. Call your local emergency services (911 in the [place] Do not drive yourself to the hospital. Summary Chronic kidney disease (CKD) occurs when the kidneys become damaged slowly over a long period of time. The most common causes of this condition are diabetes and high blood pressure (hypertension). There is no cure for most cases of CKD, but treatment usually relieves symptoms and prevents or slows the worsening of the disease. Treatment may include a combination of lifestyle changes, medicines, and dialysis. This information is not intended to replace advice given to you by your health care provider. Make sure you discuss any questions you have with your health care provider. Document Revised: 02/28/2020 Document Reviewed: 02/28/2020 Elsevier Patient Education  2023 Elsevier Inc.  

## 2023-03-31 NOTE — Progress Notes (Signed)
Established Patient Office Visit  Subjective   Patient ID: Mason Rangel, male    DOB: Nov 15, 1966  Age: 57 y.o. MRN: [id number]  Chief Complaint  Patient presents with   Follow-up          HPI Pt is a 57 yo overweight male with HTN, CKD, GERD, insomnia who presents to the clinic for 6 month follow up.   He is doing well. He continues to lose weight. On mounjaro 7.5mg  weekly. No concerns. He exercises regularly. He is not checking BP often but has noticed a little elevation when he has checked it and started back on norvasc 4 weeks ago. No CP, palpations, headaches or vision changes. He has tried to increase hydration but not drinking water like he should.   GERD and insomnia- no concerns. Medications working well.   .. Active Ambulatory Problems    Diagnosis Date Noted   Anxiety 04/06/2016   Insomnia 04/06/2016   Adjustment disorder with mixed anxiety and depressed mood 04/06/2016   Essential hypertension, benign 04/06/2016   Gastroesophageal reflux disease 04/06/2016   Esophageal stenosis 04/06/2016   Hypogonadism in male 10/19/2016   LVH (left ventricular hypertrophy) due to hypertensive disease, without heart failure 03/24/2017   Diastolic dysfunction 04/28/2017   Plantar fasciitis 12/28/2017   SVT (supraventricular tachycardia) 07/18/2018   Muscle spasm of back 07/18/2018   Dizziness 08/09/2018   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   Low back pain 08/09/2018   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   Syncope 08/09/2018   Thrombosed external hemorrhoid 11/20/2014   Palpitations 08/12/2018   Rupture of left distal biceps tendon 06/22/2019   Gastroesophageal reflux disease with esophagitis 09/28/2019   Hypertensive crisis 09/28/2019   Stage 3a chronic kidney disease (HCC) 05/01/2020   Elevated LDL cholesterol level 02/18/2022   Anxiety with flying 03/27/2022   Hypotension 08/17/2022   Abnormal weight gain  08/17/2022   Hiccups 08/17/2022   Burping 08/17/2022   AKI (acute kidney injury) 08/17/2022   Elevated serum creatinine 09/29/2022   Itching of ear 12/16/2022   Abnormality of trigeminal nerve 12/16/2022   History of shingles 12/16/2022   Overweight (BMI 25.0-29.9) 03/31/2023   Resolved Ambulatory Problems    Diagnosis Date Noted   Left sided chest pain 04/06/2016   Swelling of foot joint 07/10/2016   Right foot pain 07/10/2016   Hypertension, uncontrolled 11/22/2017   Epididymitis 08/09/2018   Fever 08/09/2018   Screening for cholesterol level 08/12/2018   Acute bilateral low back pain without sciatica 05/01/2020   Past Medical History:  Diagnosis Date   Gout    Hypertension     ROS See HPI.    Objective:     BP (!) 148/72   Pulse 77   Ht 6' (1.829 m)   Wt 210 lb (95.3 kg)   SpO2 99%   BMI 28.48 kg/m  BP Readings from Last 3 Encounters:  03/31/23 (!) 148/72  12/15/22 (!) 149/83  09/28/22 (!) 138/90   Wt Readings from Last 3 Encounters:  03/31/23 210 lb (95.3 kg)  09/28/22 216 lb (98 kg)  08/17/22 216 lb (98 kg)      Physical Exam Constitutional:      Appearance: Normal appearance.  HENT:     Head: Normocephalic.  Cardiovascular:     Rate and Rhythm: Normal rate and regular rhythm.     Pulses: Normal pulses.     Heart sounds: Normal heart sounds.  Pulmonary:     Effort: Pulmonary effort is normal.  Musculoskeletal:     Right lower leg: No edema.     Left lower leg: No edema.  Neurological:     General: No focal deficit present.     Mental Status: He is alert and oriented to person, place, and time.  Psychiatric:        Mood and Affect: Mood normal.        The 10-year ASCVD risk score (Arnett DK, et al., 2019) is: 11.3%    Assessment & Plan:  [name] KitchenMarland KitchenHashim was seen today for follow-up.  Diagnoses and all orders for this visit:  Essential hypertension, benign -     olmesartan-hydrochlorothiazide (BENICAR HCT) 40-25 MG tablet; Take 1  tablet by mouth daily. -     amLODipine (NORVASC) 5 MG tablet; Take 1 tablet (5 mg total) by mouth daily. -     dapagliflozin propanediol (FARXIGA) 10 MG TABS tablet; Take 1 tablet (10 mg total) by mouth daily. -     BASIC METABOLIC PANEL WITH GFR  Elevated serum creatinine -     olmesartan-hydrochlorothiazide (BENICAR HCT) 40-25 MG tablet; Take 1 tablet by mouth daily. -     amLODipine (NORVASC) 5 MG tablet; Take 1 tablet (5 mg total) by mouth daily. -     dapagliflozin propanediol (FARXIGA) 10 MG TABS tablet; Take 1 tablet (10 mg total) by mouth daily. -     BASIC METABOLIC PANEL WITH GFR  Stage 3a chronic kidney disease -     olmesartan-hydrochlorothiazide (BENICAR HCT) 40-25 MG tablet; Take 1 tablet by mouth daily. -     amLODipine (NORVASC) 5 MG tablet; Take 1 tablet (5 mg total) by mouth daily. -     dapagliflozin propanediol (FARXIGA) 10 MG TABS tablet; Take 1 tablet (10 mg total) by mouth daily. -     BASIC METABOLIC PANEL WITH GFR  Abnormal weight gain -     tirzepatide (MOUNJARO) 5 MG/0.5ML Pen; Inject 7.5 mg into the skin once a week.  Overweight (BMI 25.0-29.9)   GFR decreased again Kidneys look a little dry increase hydration BP not to goal Increased benicar/HCTZ Continue norvasc Added farxiga for prevent further kidney function declined and perhaps decreased BP  Decreased sodium and processed foods in diet Continue regular exercise Recheck BMP in 3-4 weeks  Doing well with insomnia and GERD does not need refills at this time   Tandy Gaw, PA-C

## 2023-04-07 DIAGNOSIS — E291 Testicular hypofunction: Secondary | ICD-10-CM | POA: Diagnosis not present

## 2023-04-07 DIAGNOSIS — I10 Essential (primary) hypertension: Secondary | ICD-10-CM | POA: Diagnosis not present

## 2023-04-08 ENCOUNTER — Other Ambulatory Visit: Payer: Self-pay | Admitting: Physician Assistant

## 2023-04-08 DIAGNOSIS — G509 Disorder of trigeminal nerve, unspecified: Secondary | ICD-10-CM

## 2023-04-08 DIAGNOSIS — Z8619 Personal history of other infectious and parasitic diseases: Secondary | ICD-10-CM

## 2023-04-13 DIAGNOSIS — E291 Testicular hypofunction: Secondary | ICD-10-CM | POA: Diagnosis not present

## 2023-04-13 DIAGNOSIS — E663 Overweight: Secondary | ICD-10-CM | POA: Diagnosis not present

## 2023-04-13 DIAGNOSIS — Z6832 Body mass index (BMI) 32.0-32.9, adult: Secondary | ICD-10-CM | POA: Diagnosis not present

## 2023-04-13 DIAGNOSIS — I10 Essential (primary) hypertension: Secondary | ICD-10-CM | POA: Diagnosis not present

## 2023-05-12 ENCOUNTER — Other Ambulatory Visit: Payer: Self-pay | Admitting: Physician Assistant

## 2023-05-12 DIAGNOSIS — Z8619 Personal history of other infectious and parasitic diseases: Secondary | ICD-10-CM

## 2023-05-12 DIAGNOSIS — G509 Disorder of trigeminal nerve, unspecified: Secondary | ICD-10-CM

## 2023-06-08 ENCOUNTER — Other Ambulatory Visit: Payer: Self-pay | Admitting: Physician Assistant

## 2023-06-08 DIAGNOSIS — Z8619 Personal history of other infectious and parasitic diseases: Secondary | ICD-10-CM

## 2023-06-08 DIAGNOSIS — G509 Disorder of trigeminal nerve, unspecified: Secondary | ICD-10-CM

## 2023-06-15 ENCOUNTER — Other Ambulatory Visit: Payer: Self-pay | Admitting: Physician Assistant

## 2023-06-15 DIAGNOSIS — K2100 Gastro-esophageal reflux disease with esophagitis, without bleeding: Secondary | ICD-10-CM

## 2023-06-15 MED ORDER — ESOMEPRAZOLE MAGNESIUM 40 MG PO CPDR: 40.0000 mg | DELAYED_RELEASE_CAPSULE | Freq: Every day | ORAL | 3 refills | Status: DC

## 2023-07-10 ENCOUNTER — Other Ambulatory Visit: Payer: Self-pay | Admitting: Physician Assistant

## 2023-07-10 DIAGNOSIS — G509 Disorder of trigeminal nerve, unspecified: Secondary | ICD-10-CM

## 2023-07-10 DIAGNOSIS — Z8619 Personal history of other infectious and parasitic diseases: Secondary | ICD-10-CM

## 2023-07-10 DIAGNOSIS — F5101 Primary insomnia: Secondary | ICD-10-CM

## 2023-07-14 ENCOUNTER — Other Ambulatory Visit: Payer: Self-pay | Admitting: Physician Assistant

## 2023-07-14 DIAGNOSIS — F5101 Primary insomnia: Secondary | ICD-10-CM

## 2023-07-14 MED ORDER — ZOLPIDEM TARTRATE 10 MG PO TABS
10.0000 mg | ORAL_TABLET | Freq: Every day | ORAL | 1 refills | Status: DC
Start: 2023-07-14 — End: 2024-02-09

## 2023-07-14 NOTE — Progress Notes (Signed)
Ambien refills sent.

## 2023-07-21 ENCOUNTER — Ambulatory Visit (INDEPENDENT_AMBULATORY_CARE_PROVIDER_SITE_OTHER): Payer: 59 | Admitting: Physician Assistant

## 2023-07-21 ENCOUNTER — Encounter: Payer: Self-pay | Admitting: Physician Assistant

## 2023-07-21 ENCOUNTER — Ambulatory Visit (INDEPENDENT_AMBULATORY_CARE_PROVIDER_SITE_OTHER): Payer: 59

## 2023-07-21 VITALS — BP 127/70 | HR 87 | Ht 72.0 in | Wt 198.0 lb

## 2023-07-21 DIAGNOSIS — R109 Unspecified abdominal pain: Secondary | ICD-10-CM

## 2023-07-21 DIAGNOSIS — R634 Abnormal weight loss: Secondary | ICD-10-CM

## 2023-07-21 DIAGNOSIS — R1013 Epigastric pain: Secondary | ICD-10-CM | POA: Diagnosis not present

## 2023-07-21 DIAGNOSIS — R0781 Pleurodynia: Secondary | ICD-10-CM

## 2023-07-21 DIAGNOSIS — N1831 Chronic kidney disease, stage 3a: Secondary | ICD-10-CM | POA: Diagnosis not present

## 2023-07-21 DIAGNOSIS — Z87442 Personal history of urinary calculi: Secondary | ICD-10-CM

## 2023-07-21 DIAGNOSIS — R42 Dizziness and giddiness: Secondary | ICD-10-CM | POA: Diagnosis not present

## 2023-07-21 DIAGNOSIS — R0602 Shortness of breath: Secondary | ICD-10-CM | POA: Diagnosis not present

## 2023-07-21 DIAGNOSIS — R635 Abnormal weight gain: Secondary | ICD-10-CM

## 2023-07-21 DIAGNOSIS — R5383 Other fatigue: Secondary | ICD-10-CM | POA: Insufficient documentation

## 2023-07-21 LAB — POCT URINALYSIS DIP (CLINITEK)
Bilirubin, UA: NEGATIVE
Blood, UA: NEGATIVE
Glucose, UA: NEGATIVE mg/dL
Ketones, POC UA: NEGATIVE mg/dL
Leukocytes, UA: NEGATIVE
Nitrite, UA: NEGATIVE
POC PROTEIN,UA: NEGATIVE
Spec Grav, UA: 1.015 (ref 1.010–1.025)
Urobilinogen, UA: 0.2 E.U./dL
pH, UA: 5.5 (ref 5.0–8.0)

## 2023-07-21 NOTE — Progress Notes (Signed)
Normal chest and rib xray. I am going to go ahead and order [place] of kidneys to schedule as well.

## 2023-07-21 NOTE — Progress Notes (Unsigned)
   Acute Office Visit  Subjective:     Patient ID: Mason Rangel, male    DOB: 26-Dec-1965, 57 y.o.   MRN: [id number]  Chief Complaint  Patient presents with  . Chest Pain    HPI Patient is in today for ***  Med   1 week left side  No push  Starts mid day  Lay down better Doesn't both when sleep  Light break step  Making himself eat   ROS      Objective:    There were no vitals taken for this visit. {Vitals History (Optional):23777}  Physical Exam  No results found for any visits on 07/21/23.      Assessment & Plan:   No orders of the defined types were placed in this encounter.   No follow-ups on file.  Tandy Gaw, PA-C

## 2023-07-21 NOTE — Patient Instructions (Signed)
Cut norvasc in half daily.

## 2023-07-22 MED ORDER — TIRZEPATIDE 5 MG/0.5ML ~~LOC~~ SOPN
PEN_INJECTOR | SUBCUTANEOUS | Status: AC
Start: 2023-07-22 — End: ?

## 2023-07-22 NOTE — Progress Notes (Signed)
Kidney function(GFR) improving.  Kidneys [place] look a little dry. Make sure staying hydrated.  Vitamin D looks great! Thyroid looks great.

## 2023-07-23 LAB — CMP14+EGFR
ALT: 22 IU/L (ref 0–44)
AST: 24 IU/L (ref 0–40)
Albumin: 4.2 g/dL (ref 3.8–4.9)
Alkaline Phosphatase: 45 IU/L (ref 44–121)
BUN/Creatinine Ratio: 17 (ref 9–20)
BUN: 23 mg/dL (ref 6–24)
Bilirubin Total: 0.6 mg/dL (ref 0.0–1.2)
CO2: 22 mmol/L (ref 20–29)
Calcium: 9.7 mg/dL (ref 8.7–10.2)
Chloride: 101 mmol/L (ref 96–106)
Creatinine, Ser: 1.33 mg/dL — ABNORMAL HIGH (ref 0.76–1.27)
Globulin, Total: 2.8 g/dL (ref 1.5–4.5)
Glucose: 97 mg/dL (ref 70–99)
Potassium: 4.3 mmol/L (ref 3.5–5.2)
Sodium: 136 mmol/L (ref 134–144)
Total Protein: 7 g/dL (ref 6.0–8.5)
eGFR: 62 mL/min/{1.73_m2} (ref >59)

## 2023-07-23 LAB — MAGNESIUM: Magnesium: 1.8 mg/dL (ref 1.6–2.3)

## 2023-07-23 LAB — B12 AND FOLATE PANEL
Folate: >20 ng/mL (ref >3.0)
Vitamin B-12: 611 pg/mL (ref 232–1245)

## 2023-07-23 LAB — TSH: TSH: 0.909 u[IU]/mL (ref 0.450–4.500)

## 2023-07-23 LAB — VITAMIN D 25 HYDROXY (VIT D DEFICIENCY, FRACTURES): Vit D, 25-Hydroxy: 53.2 ng/mL (ref 30.0–100.0)

## 2023-07-23 NOTE — Progress Notes (Signed)
Pt calls back with worsen mid back pain across left and right that wraps around stomach into abdomen. He also started having diarrhea. He was told to stop zepbound. We are adding lipase and CBC to labs. Will add abdomen u/s to renal u/s. Concern for pancreatitis.

## 2023-07-23 NOTE — Progress Notes (Signed)
  Magnesium low normal. Consider magnesium supplement at bedtime once your diarrhea clears.   Will a nurse add lipase to these labs and a CBC if we can.

## 2023-07-23 NOTE — Addendum Note (Signed)
Addended by: Jomarie Longs on: 07/23/2023 07:53 AM   Modules accepted: Orders

## 2023-07-26 ENCOUNTER — Ambulatory Visit (INDEPENDENT_AMBULATORY_CARE_PROVIDER_SITE_OTHER): Payer: 59

## 2023-07-26 DIAGNOSIS — R109 Unspecified abdominal pain: Secondary | ICD-10-CM

## 2023-07-26 DIAGNOSIS — R1013 Epigastric pain: Secondary | ICD-10-CM

## 2023-07-26 DIAGNOSIS — Z87442 Personal history of urinary calculi: Secondary | ICD-10-CM

## 2023-07-26 NOTE — Progress Notes (Signed)
Completely normal u/s  No gallstones No kidney stones

## 2023-07-26 NOTE — Progress Notes (Signed)
Normal lipase

## 2023-07-31 LAB — SPECIMEN STATUS REPORT

## 2023-07-31 LAB — LIPASE: Lipase: 58 U/L (ref 13–78)

## 2023-08-30 DIAGNOSIS — E291 Testicular hypofunction: Secondary | ICD-10-CM | POA: Diagnosis not present

## 2023-08-30 DIAGNOSIS — I10 Essential (primary) hypertension: Secondary | ICD-10-CM | POA: Diagnosis not present

## 2023-09-03 DIAGNOSIS — E663 Overweight: Secondary | ICD-10-CM | POA: Diagnosis not present

## 2023-09-03 DIAGNOSIS — Z6827 Body mass index (BMI) 27.0-27.9, adult: Secondary | ICD-10-CM | POA: Diagnosis not present

## 2023-09-03 DIAGNOSIS — I10 Essential (primary) hypertension: Secondary | ICD-10-CM | POA: Diagnosis not present

## 2023-09-03 DIAGNOSIS — E291 Testicular hypofunction: Secondary | ICD-10-CM | POA: Diagnosis not present

## 2023-10-07 ENCOUNTER — Other Ambulatory Visit: Payer: Self-pay | Admitting: Physician Assistant

## 2023-10-07 DIAGNOSIS — Z8619 Personal history of other infectious and parasitic diseases: Secondary | ICD-10-CM

## 2023-10-07 DIAGNOSIS — G509 Disorder of trigeminal nerve, unspecified: Secondary | ICD-10-CM

## 2023-10-12 ENCOUNTER — Other Ambulatory Visit: Payer: Self-pay | Admitting: Physician Assistant

## 2023-11-24 DIAGNOSIS — R1310 Dysphagia, unspecified: Secondary | ICD-10-CM | POA: Diagnosis not present

## 2023-11-24 DIAGNOSIS — K222 Esophageal obstruction: Secondary | ICD-10-CM | POA: Diagnosis not present

## 2023-11-29 DIAGNOSIS — R1314 Dysphagia, pharyngoesophageal phase: Secondary | ICD-10-CM | POA: Diagnosis not present

## 2023-11-29 DIAGNOSIS — Z1211 Encounter for screening for malignant neoplasm of colon: Secondary | ICD-10-CM | POA: Diagnosis not present

## 2023-11-29 DIAGNOSIS — K222 Esophageal obstruction: Secondary | ICD-10-CM | POA: Diagnosis not present

## 2023-12-08 ENCOUNTER — Other Ambulatory Visit: Payer: Self-pay | Admitting: Physician Assistant

## 2023-12-08 DIAGNOSIS — Z8619 Personal history of other infectious and parasitic diseases: Secondary | ICD-10-CM

## 2023-12-08 DIAGNOSIS — G509 Disorder of trigeminal nerve, unspecified: Secondary | ICD-10-CM

## 2024-01-09 ENCOUNTER — Other Ambulatory Visit: Payer: Self-pay | Admitting: Physician Assistant

## 2024-01-09 DIAGNOSIS — Z8619 Personal history of other infectious and parasitic diseases: Secondary | ICD-10-CM

## 2024-01-09 DIAGNOSIS — G509 Disorder of trigeminal nerve, unspecified: Secondary | ICD-10-CM

## 2024-01-14 ENCOUNTER — Other Ambulatory Visit: Payer: Self-pay | Admitting: Physician Assistant

## 2024-01-14 DIAGNOSIS — L0291 Cutaneous abscess, unspecified: Secondary | ICD-10-CM

## 2024-01-14 MED ORDER — DOXYCYCLINE HYCLATE 100 MG PO TABS: 100.0000 mg | ORAL_TABLET | Freq: Two times a day (BID) | ORAL | 0 refills | Status: DC

## 2024-01-14 NOTE — Progress Notes (Signed)
 Abscess right wrist-doxy sent.

## 2024-02-05 ENCOUNTER — Other Ambulatory Visit: Payer: Self-pay | Admitting: Physician Assistant

## 2024-02-05 DIAGNOSIS — N1831 Chronic kidney disease, stage 3a: Secondary | ICD-10-CM

## 2024-02-05 DIAGNOSIS — R7989 Other specified abnormal findings of blood chemistry: Secondary | ICD-10-CM

## 2024-02-05 DIAGNOSIS — I10 Essential (primary) hypertension: Secondary | ICD-10-CM

## 2024-02-08 ENCOUNTER — Other Ambulatory Visit: Payer: Self-pay | Admitting: Physician Assistant

## 2024-02-08 DIAGNOSIS — F5101 Primary insomnia: Secondary | ICD-10-CM

## 2024-03-07 ENCOUNTER — Other Ambulatory Visit: Payer: Self-pay | Admitting: Physician Assistant

## 2024-03-07 DIAGNOSIS — F5101 Primary insomnia: Secondary | ICD-10-CM

## 2024-03-10 ENCOUNTER — Other Ambulatory Visit: Payer: Self-pay | Admitting: Physician Assistant

## 2024-03-10 DIAGNOSIS — R7989 Other specified abnormal findings of blood chemistry: Secondary | ICD-10-CM

## 2024-03-10 DIAGNOSIS — I10 Essential (primary) hypertension: Secondary | ICD-10-CM

## 2024-03-10 DIAGNOSIS — F5101 Primary insomnia: Secondary | ICD-10-CM

## 2024-03-10 DIAGNOSIS — N1831 Chronic kidney disease, stage 3a: Secondary | ICD-10-CM

## 2024-03-10 DIAGNOSIS — K2100 Gastro-esophageal reflux disease with esophagitis, without bleeding: Secondary | ICD-10-CM

## 2024-03-10 MED ORDER — ESOMEPRAZOLE MAGNESIUM 40 MG PO CPDR: 40.0000 mg | DELAYED_RELEASE_CAPSULE | Freq: Every day | ORAL | 3 refills | Status: AC

## 2024-03-10 MED ORDER — TRAZODONE HCL 50 MG PO TABS: ORAL_TABLET | ORAL | 3 refills | Status: AC

## 2024-03-10 MED ORDER — AMLODIPINE BESYLATE 5 MG PO TABS: 5.0000 mg | ORAL_TABLET | Freq: Every day | ORAL | 1 refills | Status: DC

## 2024-03-14 DIAGNOSIS — E291 Testicular hypofunction: Secondary | ICD-10-CM | POA: Diagnosis not present

## 2024-03-21 DIAGNOSIS — E291 Testicular hypofunction: Secondary | ICD-10-CM | POA: Diagnosis not present

## 2024-03-21 DIAGNOSIS — D696 Thrombocytopenia, unspecified: Secondary | ICD-10-CM | POA: Diagnosis not present

## 2024-03-21 DIAGNOSIS — Z6827 Body mass index (BMI) 27.0-27.9, adult: Secondary | ICD-10-CM | POA: Diagnosis not present

## 2024-03-21 DIAGNOSIS — I10 Essential (primary) hypertension: Secondary | ICD-10-CM | POA: Diagnosis not present

## 2024-03-21 DIAGNOSIS — E663 Overweight: Secondary | ICD-10-CM | POA: Diagnosis not present

## 2024-03-25 ENCOUNTER — Other Ambulatory Visit: Payer: Self-pay | Admitting: Physician Assistant

## 2024-03-25 DIAGNOSIS — G509 Disorder of trigeminal nerve, unspecified: Secondary | ICD-10-CM

## 2024-03-25 DIAGNOSIS — Z8619 Personal history of other infectious and parasitic diseases: Secondary | ICD-10-CM

## 2024-05-03 ENCOUNTER — Other Ambulatory Visit: Payer: Self-pay | Admitting: Physician Assistant

## 2024-05-03 DIAGNOSIS — G509 Disorder of trigeminal nerve, unspecified: Secondary | ICD-10-CM

## 2024-05-03 DIAGNOSIS — Z8619 Personal history of other infectious and parasitic diseases: Secondary | ICD-10-CM

## 2024-05-03 MED ORDER — AMOXICILLIN-POT CLAVULANATE 875-125 MG PO TABS: 1.0000 | ORAL_TABLET | Freq: Two times a day (BID) | ORAL | 0 refills | Status: DC

## 2024-05-03 NOTE — Progress Notes (Signed)
 URI symptoms for over a week. Pressure and fullness over sinuses. Sinus drainage. Failed nyquil and dayquil and flonase . No fever, chills body aches. Sent augmentin for sinus infection.

## 2024-05-09 ENCOUNTER — Other Ambulatory Visit: Payer: Self-pay | Admitting: Physician Assistant

## 2024-05-09 DIAGNOSIS — F5101 Primary insomnia: Secondary | ICD-10-CM

## 2024-05-10 NOTE — Telephone Encounter (Signed)
 Needs appt

## 2024-05-10 NOTE — Telephone Encounter (Signed)
 Left message advising the need of a follow up appointment. Advised to call back to schedule.

## 2024-05-12 ENCOUNTER — Other Ambulatory Visit: Payer: Self-pay | Admitting: Physician Assistant

## 2024-05-12 DIAGNOSIS — F5101 Primary insomnia: Secondary | ICD-10-CM

## 2024-05-12 MED ORDER — ZOLPIDEM TARTRATE 10 MG PO TABS: 10.0000 mg | ORAL_TABLET | Freq: Every day | ORAL | 0 refills | Status: DC

## 2024-05-15 ENCOUNTER — Telehealth: Payer: Self-pay

## 2024-05-15 ENCOUNTER — Other Ambulatory Visit (HOSPITAL_COMMUNITY): Payer: Self-pay

## 2024-05-15 ENCOUNTER — Ambulatory Visit (INDEPENDENT_AMBULATORY_CARE_PROVIDER_SITE_OTHER): Admitting: Physician Assistant

## 2024-05-15 ENCOUNTER — Encounter: Payer: Self-pay | Admitting: Physician Assistant

## 2024-05-15 VITALS — BP 124/82 | HR 77 | Ht >= 80 in | Wt 198.0 lb

## 2024-05-15 DIAGNOSIS — I10 Essential (primary) hypertension: Secondary | ICD-10-CM | POA: Diagnosis not present

## 2024-05-15 DIAGNOSIS — N1831 Chronic kidney disease, stage 3a: Secondary | ICD-10-CM | POA: Diagnosis not present

## 2024-05-15 DIAGNOSIS — Z0000 Encounter for general adult medical examination without abnormal findings: Secondary | ICD-10-CM

## 2024-05-15 DIAGNOSIS — K2100 Gastro-esophageal reflux disease with esophagitis, without bleeding: Secondary | ICD-10-CM | POA: Diagnosis not present

## 2024-05-15 NOTE — Telephone Encounter (Signed)
 Pharmacy Patient Advocate Encounter   Received notification from CoverMyMeds that prior authorization for Zolpidem  Tartrate 10MG  tablets is required/requested.   Insurance verification completed.   The patient is insured through [facility] .   Per test claim: PA required and submitted KEY/EOC/Request #: [id number] from 05/15/24 to 06/04/25. Ran test claim, Copay is $0. This test claim was processed through [facility]- copay amounts may vary at other pharmacies due to pharmacy/plan contracts, or as the patient moves through the different stages of their insurance plan.

## 2024-05-15 NOTE — Progress Notes (Unsigned)
   Established Patient Office Visit  Subjective   Patient ID: Mason Rangel, male    DOB: 08-23-1966  Age: 58 y.o. MRN: [id number]  No chief complaint on file.   HPI  {History (Optional):23778}  ROS    Objective:     There were no vitals taken for this visit. {Vitals History (Optional):23777}  Physical Exam   No results found for any visits on 05/15/24.  {Labs (Optional):23779}  The 10-year ASCVD risk score (Arnett DK, et al., 2019) is: 11.2%    Assessment & Plan:   Problem List Items Addressed This Visit   None   No follow-ups on file.    Mykala Mccready, PA-C

## 2024-05-16 ENCOUNTER — Encounter: Payer: Self-pay | Admitting: Physician Assistant

## 2024-05-16 MED ORDER — OLMESARTAN MEDOXOMIL-HCTZ 40-25 MG PO TABS: 1.0000 | ORAL_TABLET | Freq: Every day | ORAL | 1 refills | Status: DC

## 2024-05-24 DIAGNOSIS — Z0000 Encounter for general adult medical examination without abnormal findings: Secondary | ICD-10-CM | POA: Diagnosis not present

## 2024-05-25 LAB — CBC WITH DIFFERENTIAL/PLATELET
Basophils Absolute: 0.1 10*3/uL (ref 0.0–0.2)
Basos: 1 %
EOS (ABSOLUTE): 0.1 10*3/uL (ref 0.0–0.4)
Eos: 2 %
Hematocrit: 44.6 % (ref 37.5–51.0)
Hemoglobin: 15.1 g/dL (ref 13.0–17.7)
Immature Grans (Abs): 0 10*3/uL (ref 0.0–0.1)
Immature Granulocytes: 0 %
Lymphocytes Absolute: 1.5 10*3/uL (ref 0.7–3.1)
Lymphs: 31 %
MCH: 32.3 pg (ref 26.6–33.0)
MCHC: 33.9 g/dL (ref 31.5–35.7)
MCV: 95 fL (ref 79–97)
Monocytes Absolute: 0.5 10*3/uL (ref 0.1–0.9)
Monocytes: 11 %
Neutrophils Absolute: 2.7 10*3/uL (ref 1.4–7.0)
Neutrophils: 55 %
Platelets: 148 10*3/uL — ABNORMAL LOW (ref 150–450)
RBC: 4.68 x10E6/uL (ref 4.14–5.80)
RDW: 13.7 % (ref 11.6–15.4)
WBC: 4.8 10*3/uL (ref 3.4–10.8)

## 2024-05-25 LAB — CMP14+EGFR
ALT: 17 IU/L (ref 0–44)
AST: 20 IU/L (ref 0–40)
Albumin: 4.2 g/dL (ref 3.8–4.9)
Alkaline Phosphatase: 53 IU/L (ref 44–121)
BUN/Creatinine Ratio: 13 (ref 9–20)
BUN: 16 mg/dL (ref 6–24)
Bilirubin Total: 0.6 mg/dL (ref 0.0–1.2)
CO2: 21 mmol/L (ref 20–29)
Calcium: 9.5 mg/dL (ref 8.7–10.2)
Chloride: 102 mmol/L (ref 96–106)
Creatinine, Ser: 1.19 mg/dL (ref 0.76–1.27)
Globulin, Total: 2.7 g/dL (ref 1.5–4.5)
Glucose: 91 mg/dL (ref 70–99)
Potassium: 4.7 mmol/L (ref 3.5–5.2)
Sodium: 137 mmol/L (ref 134–144)
Total Protein: 6.9 g/dL (ref 6.0–8.5)
eGFR: 70 mL/min/{1.73_m2} (ref >59)

## 2024-05-25 LAB — LIPID PANEL
Chol/HDL Ratio: 4.7 ratio (ref 0.0–5.0)
Cholesterol, Total: 174 mg/dL (ref 100–199)
HDL: 37 mg/dL — ABNORMAL LOW (ref >39)
LDL Chol Calc (NIH): 122 mg/dL — ABNORMAL HIGH (ref 0–99)
Triglycerides: 80 mg/dL (ref 0–149)
VLDL Cholesterol Cal: 15 mg/dL (ref 5–40)

## 2024-05-25 LAB — B12 AND FOLATE PANEL
Folate: 16.4 ng/mL (ref >3.0)
Vitamin B-12: 470 pg/mL (ref 232–1245)

## 2024-05-25 LAB — PSA: Prostate Specific Ag, Serum: 1 ng/mL (ref 0.0–4.0)

## 2024-05-25 LAB — VITAMIN D 25 HYDROXY (VIT D DEFICIENCY, FRACTURES): Vit D, 25-Hydroxy: 50.6 ng/mL (ref 30.0–100.0)

## 2024-05-26 ENCOUNTER — Ambulatory Visit: Payer: Self-pay | Admitting: Physician Assistant

## 2024-05-26 DIAGNOSIS — E7800 Pure hypercholesterolemia, unspecified: Secondary | ICD-10-CM

## 2024-05-26 NOTE — Progress Notes (Signed)
 Mason Rangel,   Platelets a little low but stable from past.  Kidney function improved again!  B12 and folate normal.  B12 is lower than last check. Could start a supplement. 500mcg daily.  Vitamin D  looks great!  LDL not optimal.   10 year cardiovascular risk is 9.6 percent. Above 7.5 percent we suggest starting a statin for CV prevention, thoughts?   Aaron AasAaron AasThe 10-year ASCVD risk score (Arnett DK, et al., 2019) is: 9.6%   Values used to calculate the score:     Age: 58 years     Clincally relevant sex: Male     Is Non-Hispanic African American: No     Diabetic: No     Tobacco smoker: No     Systolic Blood Pressure: 124 mmHg     Is BP treated: Yes     HDL Cholesterol: 37 mg/dL     Total Cholesterol: 174 mg/dL

## 2024-06-23 DIAGNOSIS — E291 Testicular hypofunction: Secondary | ICD-10-CM | POA: Diagnosis not present

## 2024-07-04 DIAGNOSIS — R7989 Other specified abnormal findings of blood chemistry: Secondary | ICD-10-CM | POA: Diagnosis not present

## 2024-07-04 DIAGNOSIS — E291 Testicular hypofunction: Secondary | ICD-10-CM | POA: Diagnosis not present

## 2024-07-04 DIAGNOSIS — I10 Essential (primary) hypertension: Secondary | ICD-10-CM | POA: Diagnosis not present

## 2024-07-04 DIAGNOSIS — D696 Thrombocytopenia, unspecified: Secondary | ICD-10-CM | POA: Diagnosis not present

## 2024-07-04 DIAGNOSIS — E663 Overweight: Secondary | ICD-10-CM | POA: Diagnosis not present

## 2024-07-19 ENCOUNTER — Other Ambulatory Visit: Payer: Self-pay | Admitting: Physician Assistant

## 2024-07-19 DIAGNOSIS — G509 Disorder of trigeminal nerve, unspecified: Secondary | ICD-10-CM

## 2024-07-19 DIAGNOSIS — Z8619 Personal history of other infectious and parasitic diseases: Secondary | ICD-10-CM

## 2024-09-24 ENCOUNTER — Other Ambulatory Visit: Payer: Self-pay | Admitting: Physician Assistant

## 2024-09-24 DIAGNOSIS — G509 Disorder of trigeminal nerve, unspecified: Secondary | ICD-10-CM

## 2024-09-24 DIAGNOSIS — Z8619 Personal history of other infectious and parasitic diseases: Secondary | ICD-10-CM

## 2024-11-10 ENCOUNTER — Other Ambulatory Visit: Payer: Self-pay | Admitting: Physician Assistant

## 2024-11-10 DIAGNOSIS — F5101 Primary insomnia: Secondary | ICD-10-CM

## 2024-11-10 DIAGNOSIS — Z8619 Personal history of other infectious and parasitic diseases: Secondary | ICD-10-CM

## 2024-11-10 DIAGNOSIS — G509 Disorder of trigeminal nerve, unspecified: Secondary | ICD-10-CM

## 2024-11-14 ENCOUNTER — Other Ambulatory Visit: Payer: Self-pay | Admitting: Physician Assistant

## 2024-11-14 ENCOUNTER — Ambulatory Visit: Admitting: Physician Assistant

## 2024-11-14 VITALS — BP 130/80 | HR 75 | Ht 74.0 in | Wt 203.0 lb

## 2024-11-14 DIAGNOSIS — G509 Disorder of trigeminal nerve, unspecified: Secondary | ICD-10-CM

## 2024-11-14 DIAGNOSIS — F5101 Primary insomnia: Secondary | ICD-10-CM

## 2024-11-14 DIAGNOSIS — I10 Essential (primary) hypertension: Secondary | ICD-10-CM

## 2024-11-14 DIAGNOSIS — N1831 Chronic kidney disease, stage 3a: Secondary | ICD-10-CM

## 2024-11-14 DIAGNOSIS — Z23 Encounter for immunization: Secondary | ICD-10-CM | POA: Diagnosis not present

## 2024-11-14 DIAGNOSIS — Z8619 Personal history of other infectious and parasitic diseases: Secondary | ICD-10-CM

## 2024-11-14 MED ORDER — ZOLPIDEM TARTRATE 10 MG PO TABS: 10.0000 mg | ORAL_TABLET | Freq: Every day | ORAL | 1 refills | Status: AC

## 2024-11-14 MED ORDER — OLMESARTAN MEDOXOMIL-HCTZ 40-25 MG PO TABS: 1.0000 | ORAL_TABLET | Freq: Every day | ORAL | 1 refills | Status: AC

## 2024-11-15 ENCOUNTER — Encounter: Payer: Self-pay | Admitting: Physician Assistant

## 2024-11-15 DIAGNOSIS — N1831 Chronic kidney disease, stage 3a: Secondary | ICD-10-CM | POA: Diagnosis not present

## 2024-11-15 DIAGNOSIS — Z23 Encounter for immunization: Secondary | ICD-10-CM | POA: Diagnosis not present

## 2024-11-15 DIAGNOSIS — F5101 Primary insomnia: Secondary | ICD-10-CM | POA: Diagnosis not present

## 2024-11-15 DIAGNOSIS — I10 Essential (primary) hypertension: Secondary | ICD-10-CM | POA: Diagnosis not present

## 2024-11-15 NOTE — Progress Notes (Signed)
 [id number]

## 2024-11-15 NOTE — Progress Notes (Signed)
° °  Established Patient Office Visit  Subjective   Patient ID: Mason Rangel, male    DOB: 04-01-1966  Age: 58 y.o. MRN: [id number]  Chief Complaint  Patient presents with   Medical Management of Chronic Issues    HPI Pt is a 58 yo male with HTN, Insomnia, CKD 3a, GERD who presents to the clinic for medication follow up.   Pt is taking benicar  hct daily with no concerns. He checks bP from time to time but not regularly. He denies any CP, palpitations, headaches or vision changes.   He is sleeping well with ambien  and as needed ambien  trazodone .   He feels great. Mood is good. He is very active.   Review of Systems  All other systems reviewed and are negative.     Objective:     BP 130/80   Pulse 75   Ht 6' 2 (1.88 m)   Wt 203 lb (92.1 kg)   SpO2 99%   BMI 26.06 kg/m  BP Readings from Last 3 Encounters:  11/14/24 130/80  05/15/24 124/82  07/21/23 127/70   Wt Readings from Last 3 Encounters:  11/14/24 203 lb (92.1 kg)  05/15/24 198 lb (89.8 kg)  07/21/23 198 lb (89.8 kg)      Physical Exam Constitutional:      Appearance: Normal appearance.  HENT:     Head: Normocephalic.  Cardiovascular:     Rate and Rhythm: Normal rate and regular rhythm.  Pulmonary:     Effort: Pulmonary effort is normal.     Breath sounds: Normal breath sounds.  Neurological:     General: No focal deficit present.     Mental Status: He is alert and oriented to person, place, and time.  Psychiatric:        Mood and Affect: Mood normal.      The 10-year ASCVD risk score (Arnett DK, et al., 2019) is: 10.4%    Assessment & Plan:  .Omari Mcmanaway was seen today for medical management of chronic issues.  Diagnoses and all orders for this visit:  Essential hypertension, benign -     olmesartan -hydrochlorothiazide (BENICAR  HCT) 40-25 MG tablet; Take 1 tablet by mouth daily.  Stage 3a chronic kidney disease (HCC) -     olmesartan -hydrochlorothiazide (BENICAR  HCT) 40-25 MG  tablet; Take 1 tablet by mouth daily.  Primary insomnia -     zolpidem  (AMBIEN ) 10 MG tablet; Take 1 tablet (10 mg total) by mouth at bedtime.  Immunization due -     Flu vaccine trivalent PF, 6mos and older(Flulaval,Afluria,Fluarix,Fluzone)  Need for Tdap vaccination -     Tdap vaccine greater than or equal to 7yo IM   BP to goal on 2nd recheck Refilled Benicar  CMP, UTD  Insomnia controlled on ambien  and trazodone .   Flu and Tdap given today in office.    Return in about 6 months (around 05/15/2025).    Takari Lundahl, PA-C

## 2024-11-24 ENCOUNTER — Other Ambulatory Visit: Payer: Self-pay | Admitting: Physician Assistant

## 2024-11-24 MED ORDER — CYCLOBENZAPRINE HCL 5 MG PO TABS: 5.0000 mg | ORAL_TABLET | Freq: Three times a day (TID) | ORAL | 1 refills | Status: AC | PRN

## 2024-12-31 ENCOUNTER — Other Ambulatory Visit: Payer: Self-pay | Admitting: Physician Assistant

## 2024-12-31 DIAGNOSIS — G509 Disorder of trigeminal nerve, unspecified: Secondary | ICD-10-CM

## 2024-12-31 DIAGNOSIS — Z8619 Personal history of other infectious and parasitic diseases: Secondary | ICD-10-CM

## 2025-05-15 ENCOUNTER — Ambulatory Visit: Admitting: Physician Assistant
